# Patient Record
Sex: Female | Born: 1990 | Race: Black or African American | Hispanic: No | Smoking: Current every day smoker
Health system: Southern US, Community
[De-identification: ages and names within clinical notes are randomized; demographics above are authoritative.]

## PROBLEM LIST (undated history)

## (undated) ENCOUNTER — Inpatient Hospital Stay: Payer: Self-pay

## (undated) DIAGNOSIS — Z8742 Personal history of other diseases of the female genital tract: Secondary | ICD-10-CM

## (undated) DIAGNOSIS — A6000 Herpesviral infection of urogenital system, unspecified: Secondary | ICD-10-CM

## (undated) HISTORY — DX: Herpesviral infection of urogenital system, unspecified: A60.00

## (undated) HISTORY — DX: Personal history of other diseases of the female genital tract: Z87.42

## (undated) HISTORY — PX: TONSILLECTOMY: SUR1361

---
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
## 2010-06-20 ENCOUNTER — Emergency Department: Payer: Self-pay | Admitting: Emergency Medicine

## 2011-07-09 ENCOUNTER — Emergency Department: Payer: Self-pay | Admitting: Emergency Medicine

## 2012-02-27 ENCOUNTER — Observation Stay: Payer: Self-pay | Admitting: Obstetrics and Gynecology

## 2012-03-12 ENCOUNTER — Observation Stay: Payer: Self-pay | Admitting: Obstetrics and Gynecology

## 2012-03-16 ENCOUNTER — Inpatient Hospital Stay: Payer: Self-pay

## 2012-03-17 LAB — CBC WITH DIFFERENTIAL/PLATELET
Basophil #: 0 10*3/uL (ref 0.0–0.1)
Basophil %: 0.2 %
Eosinophil #: 0 10*3/uL (ref 0.0–0.7)
Eosinophil %: 0.2 %
HCT: 41.9 % (ref 35.0–47.0)
HGB: 13.4 g/dL (ref 12.0–16.0)
Lymphocyte #: 2.5 10*3/uL (ref 1.0–3.6)
Lymphocyte %: 18.2 %
MCH: 26.9 pg (ref 26.0–34.0)
MCHC: 31.9 g/dL — ABNORMAL LOW (ref 32.0–36.0)
MCV: 84 fL (ref 80–100)
Monocyte #: 1.6 x10 3/mm — ABNORMAL HIGH (ref 0.2–0.9)
Monocyte %: 11.8 %
Neutrophil #: 9.6 10*3/uL — ABNORMAL HIGH (ref 1.4–6.5)
Neutrophil %: 69.6 %
Platelet: 170 10*3/uL (ref 150–440)
RBC: 4.97 10*6/uL (ref 3.80–5.20)
RDW: 14.6 % — ABNORMAL HIGH (ref 11.5–14.5)
WBC: 13.9 10*3/uL — ABNORMAL HIGH (ref 3.6–11.0)

## 2012-03-18 LAB — HEMATOCRIT: HCT: 30.9 % — ABNORMAL LOW (ref 35.0–47.0)

## 2012-10-16 ENCOUNTER — Emergency Department: Payer: Self-pay | Admitting: Emergency Medicine

## 2012-10-16 LAB — URINALYSIS, COMPLETE
Bilirubin,UR: NEGATIVE
Glucose,UR: NEGATIVE mg/dL (ref 0–75)
Nitrite: NEGATIVE
Ph: 5 (ref 4.5–8.0)
Protein: 30
RBC,UR: 3 /HPF (ref 0–5)
Specific Gravity: 1.029 (ref 1.003–1.030)
Squamous Epithelial: 13
WBC UR: 11 /HPF (ref 0–5)

## 2012-10-16 LAB — RAPID INFLUENZA A&B ANTIGENS

## 2012-10-18 LAB — BETA STREP CULTURE(ARMC)

## 2014-09-30 LAB — HM PAP SMEAR: HM Pap smear: NEGATIVE

## 2015-08-03 ENCOUNTER — Emergency Department
Admission: EM | Admit: 2015-08-03 | Discharge: 2015-08-03 | Disposition: A | Discharge status: Home / Self Care | Payer: Self-pay | Attending: Emergency Medicine | Admitting: Emergency Medicine

## 2015-08-03 ENCOUNTER — Encounter: Payer: Self-pay | Admitting: Medical Oncology

## 2015-08-03 DIAGNOSIS — K2970 Gastritis, unspecified, without bleeding: Secondary | ICD-10-CM | POA: Insufficient documentation

## 2015-08-03 DIAGNOSIS — F17200 Nicotine dependence, unspecified, uncomplicated: Secondary | ICD-10-CM | POA: Insufficient documentation

## 2015-08-03 DIAGNOSIS — Z3202 Encounter for pregnancy test, result negative: Secondary | ICD-10-CM | POA: Insufficient documentation

## 2015-08-03 LAB — URINALYSIS COMPLETE WITH MICROSCOPIC (ARMC ONLY)
Bacteria, UA: NONE SEEN
Bilirubin Urine: NEGATIVE
Glucose, UA: 50 mg/dL — AB
Hgb urine dipstick: NEGATIVE
Ketones, ur: NEGATIVE mg/dL
Nitrite: NEGATIVE
Protein, ur: NEGATIVE mg/dL
Specific Gravity, Urine: 1.025 (ref 1.005–1.030)
pH: 6 (ref 5.0–8.0)

## 2015-08-03 LAB — COMPREHENSIVE METABOLIC PANEL
ALT: 18 U/L (ref 14–54)
AST: 19 U/L (ref 15–41)
Albumin: 4.6 g/dL (ref 3.5–5.0)
Alkaline Phosphatase: 54 U/L (ref 38–126)
Anion gap: 6 (ref 5–15)
BUN: 16 mg/dL (ref 6–20)
CO2: 27 mmol/L (ref 22–32)
Calcium: 9.2 mg/dL (ref 8.9–10.3)
Chloride: 107 mmol/L (ref 101–111)
Creatinine, Ser: 0.7 mg/dL (ref 0.44–1.00)
GFR calc Af Amer: >60 mL/min (ref >60)
GFR calc non Af Amer: >60 mL/min (ref >60)
Glucose, Bld: 111 mg/dL — ABNORMAL HIGH (ref 65–99)
Potassium: 3.8 mmol/L (ref 3.5–5.1)
Sodium: 140 mmol/L (ref 135–145)
Total Bilirubin: 0.3 mg/dL (ref 0.3–1.2)
Total Protein: 7.4 g/dL (ref 6.5–8.1)

## 2015-08-03 LAB — LIPASE, BLOOD: Lipase: 27 U/L (ref 11–51)

## 2015-08-03 LAB — CBC
HCT: 43.3 % (ref 35.0–47.0)
Hemoglobin: 14 g/dL (ref 12.0–16.0)
MCH: 26.8 pg (ref 26.0–34.0)
MCHC: 32.2 g/dL (ref 32.0–36.0)
MCV: 83.2 fL (ref 80.0–100.0)
Platelets: 210 10*3/uL (ref 150–440)
RBC: 5.21 MIL/uL — ABNORMAL HIGH (ref 3.80–5.20)
RDW: 13.7 % (ref 11.5–14.5)
WBC: 5 10*3/uL (ref 3.6–11.0)

## 2015-08-03 LAB — POCT PREGNANCY, URINE: Preg Test, Ur: NEGATIVE

## 2015-08-03 MED ORDER — GI COCKTAIL ~~LOC~~
30.0000 mL | Freq: Once | ORAL | Status: AC
  Administered 2015-08-03: 30 mL via ORAL
  Filled 2015-08-03: qty 30

## 2015-08-03 MED ORDER — PANTOPRAZOLE SODIUM 40 MG PO TBEC
40.0000 mg | DELAYED_RELEASE_TABLET | Freq: Every day | ORAL | Status: DC
Start: 2015-08-03 — End: 2017-06-10

## 2015-08-03 MED ORDER — PANTOPRAZOLE SODIUM 40 MG PO TBEC: 40.0000 mg | DELAYED_RELEASE_TABLET | Freq: Every day | ORAL | Status: DC

## 2015-08-03 NOTE — ED Provider Notes (Addendum)
[facility] Emergency Department Provider Note  Time seen: 3:28 PM  I have reviewed the triage vital signs and the nursing notes.   HISTORY  Chief Complaint Abdominal Pain    HPI Martha Tucker is a 24 y.o. female with no past medical history who presents the emergency department with upper abdominal pain. According to the patient she has had intermittent upper abdominal pain for many months at this point. For the past 2-3 days she states the pain has been more constant. Denies nausea, vomiting, diarrhea. She states the pain worsens significantly if she eats seafoods such as pepperoni pizza, or lemonade. Describes her pain currently as moderate, burning sensation. Patient also states she suffers from significant gastric reflux on a nightly basis.     History reviewed. No pertinent past medical history.  There are no active problems to display for this patient.   Past Surgical History  Procedure Laterality Date  . Tonsillectomy      No current outpatient prescriptions on file.  Allergies Review of patient's allergies indicates no known allergies.  No family history on file.  Social History Social History  Substance Use Topics  . Smoking status: Current Every Day Smoker  . Smokeless tobacco: None  . Alcohol Use: Yes     Comment: weekends    Review of Systems Constitutional: Negative for fever. Cardiovascular: Negative for chest pain. Respiratory: Negative for shortness of breath. Gastrointestinal: Upper abdominal burning pain. Negative for nausea, vomiting, diarrhea Genitourinary: Negative for dysuria. Musculoskeletal: Negative for back pain. Neurological: Negative for headache 10-point ROS otherwise negative.  ____________________________________________   PHYSICAL EXAM:  VITAL SIGNS: ED Triage Vitals  Enc Vitals Group     BP 08/03/15 1437 134/78 mmHg     Pulse Rate 08/03/15 1437 85     Resp 08/03/15 1437 18     Temp  08/03/15 1437 97.9 F (36.6 C)     Temp Source 08/03/15 1437 Oral     SpO2 08/03/15 1437 98 %     Weight 08/03/15 1437 135 lb (61.236 kg)     Height 08/03/15 1437 5\' 1"  (1.549 m)     Head Cir --      Peak Flow --      Pain Score 08/03/15 1438 5     Pain Loc --      Pain Edu? --      Excl. in GC? --     Constitutional: Alert and oriented. Well appearing and in no distress. Eyes: Normal exam ENT   Head: Normocephalic and atraumatic.   Mouth/Throat: Mucous membranes are moist. Cardiovascular: Normal rate, regular rhythm. No murmur Respiratory: Normal respiratory effort without tachypnea nor retractions. Breath sounds are clear and equal bilaterally. No wheezes/rales/rhonchi. Gastrointestinal: Mild epigastric tenderness to palpation, no right upper quadrant tenderness. Otherwise benign abdominal exam. No rebound or guarding. Musculoskeletal: Nontender with normal range of motion in all extremities. Neurologic:  Normal speech and language. No gross focal neurologic deficits  Skin:  Skin is warm, dry and intact.  Psychiatric: Mood and affect are normal. Speech and behavior are normal.   ____________________________________________    INITIAL IMPRESSION / ASSESSMENT AND PLAN / ED COURSE  Pertinent labs & imaging results that were available during my care of the patient were reviewed by me and considered in my medical decision making (see chart for details).  Patient presents with signs and symptoms most suggestive of gastritis. Labs within normal limits including a normal lipase. I discussed the possible diagnoses  with the patient, she confirms the pain is much worse with spicy food intake such as pepperoni pizza or lemonade. Also states she drinks alcohol on a daily basis, denies NSAID use. We will try GI cocktail in the emergency department for pain relief. Overall the patient appears very well, I discussed the need to follow up with a GI medicine doctor, patient is  agreeable.  States pain is nearly resolved after GI cocktail. We will discharge home with GI follow-up.  ____________________________________________   FINAL CLINICAL IMPRESSION(S) / ED DIAGNOSES  Gastritis   Minna Antis, MD 08/03/15 1531  Minna Antis, MD 08/03/15 [phone]

## 2015-08-03 NOTE — ED Notes (Signed)
Pt reports upper abd pain that has been occuring x 3 days. Denies nvd. Reports she was constipated but had BM this am.

## 2015-08-03 NOTE — ED Notes (Signed)
NAD noted at time of D/C. Pt denies questions or concerns. Pt ambulatory to the lobby at this time.  

## 2015-08-03 NOTE — Discharge Instructions (Signed)
Please call the number provided to follow-up with GI medicine for further workup and evaluation. Please avoid alcohol intake. Avoid NSAIDs such as ibuprofen, Motrin, Aleve, Advil, aspirin.   Gastritis, Adult Gastritis is soreness and puffiness (inflammation) of the lining of the stomach. If you do not get help, gastritis can cause bleeding and sores (ulcers) in the stomach. HOME CARE   Only take medicine as told by your doctor.  If you were given antibiotic medicines, take them as told. Finish the medicines even if you start to feel better.  Drink enough fluids to keep your pee (urine) clear or pale yellow.  Avoid foods and drinks that make your problems worse. Foods you may want to avoid include:  Caffeine or alcohol.  Chocolate.  Mint.  Garlic and onions.  Spicy foods.  Citrus fruits, including oranges, lemons, or limes.  Food containing tomatoes, including sauce, chili, salsa, and pizza.  Fried and fatty foods.  Eat small meals throughout the day instead of large meals. GET HELP RIGHT AWAY IF:   You have black or dark red poop (stools).  You throw up (vomit) blood. It may look like coffee grounds.  You cannot keep fluids down.  Your belly (abdominal) pain gets worse.  You have a fever.  You do not feel better after 1 week.  You have any other questions or concerns. MAKE SURE YOU:   Understand these instructions.  Will watch your condition.  Will get help right away if you are not doing well or get worse.   This information is not intended to replace advice given to you by your health care provider. Make sure you discuss any questions you have with your health care provider.   Document Released: 01/30/2008 Document Revised: 11/05/2011 Document Reviewed: 09/26/2011 Elsevier Interactive Patient Education [employer]2016 [employer] Inc.

## 2016-04-10 ENCOUNTER — Encounter: Payer: Self-pay | Admitting: Emergency Medicine

## 2016-04-10 ENCOUNTER — Emergency Department
Admission: EM | Admit: 2016-04-10 | Discharge: 2016-04-10 | Disposition: A | Discharge status: Home / Self Care | Payer: Self-pay | Attending: Emergency Medicine | Admitting: Emergency Medicine

## 2016-04-10 DIAGNOSIS — F17200 Nicotine dependence, unspecified, uncomplicated: Secondary | ICD-10-CM | POA: Insufficient documentation

## 2016-04-10 DIAGNOSIS — Z79899 Other long term (current) drug therapy: Secondary | ICD-10-CM | POA: Insufficient documentation

## 2016-04-10 DIAGNOSIS — N390 Urinary tract infection, site not specified: Secondary | ICD-10-CM | POA: Insufficient documentation

## 2016-04-10 LAB — URINALYSIS COMPLETE WITH MICROSCOPIC (ARMC ONLY)
Bacteria, UA: NONE SEEN
Bilirubin Urine: NEGATIVE
Glucose, UA: NEGATIVE mg/dL
Ketones, ur: NEGATIVE mg/dL
Nitrite: NEGATIVE
Protein, ur: 30 mg/dL — AB
Specific Gravity, Urine: 1.023 (ref 1.005–1.030)
pH: 6 (ref 5.0–8.0)

## 2016-04-10 LAB — PREGNANCY, URINE: Preg Test, Ur: NEGATIVE

## 2016-04-10 MED ORDER — PHENAZOPYRIDINE HCL 100 MG PO TABS: 100.0000 mg | ORAL_TABLET | Freq: Three times a day (TID) | ORAL | 0 refills | Status: DC | PRN

## 2016-04-10 MED ORDER — SULFAMETHOXAZOLE-TRIMETHOPRIM 800-160 MG PO TABS: 1.0000 | ORAL_TABLET | Freq: Two times a day (BID) | ORAL | 0 refills | Status: AC

## 2016-04-10 NOTE — ED Triage Notes (Signed)
Pt reports frequent, burning urination. Reports occasional flank pain when she tried to urinate

## 2016-04-10 NOTE — ED Provider Notes (Signed)
[facility] Emergency Department Provider Note  ____________________________________________  Time seen: Approximately 4:23 PM  I have reviewed the triage vital signs and the nursing notes.   HISTORY  Chief Complaint Urinary Tract Infection    HPI Martha Tucker is a 25 y.o. female, NAD, presents to the emergency department for evaluation of increased urinary frequency and hesitancy with lower abdominal pressure. Patient states that yesterday she was having increased urinary frequency but without itching, burning with urination, foul smell to her urine, vaginal discharge or suprapubic pain. This morning, states she started to have lower abdominal pressure and a feeling of incomplete voiding. She notes lower back ache at times that has now resolved. Patient denies fever, chills, body aches. No nausea, vomiting, diarrhea, saddle paraesthesias, colicky abdominal pain, loss of bowel or bladder control. She has not taken anything OTC to help with her symptoms.   History reviewed. No pertinent past medical history.  There are no active problems to display for this patient.   Past Surgical History:  Procedure Laterality Date  . TONSILLECTOMY      Prior to Admission medications   Medication Sig Start Date End Date Taking? Authorizing Provider  pantoprazole (PROTONIX) 40 MG tablet Take 1 tablet (40 mg total) by mouth daily. 08/03/15 08/02/16  Minna AntisKevin Paduchowski, MD  pantoprazole (PROTONIX) 40 MG tablet Take 1 tablet (40 mg total) by mouth daily. 08/03/15 08/02/16  Minna AntisKevin Paduchowski, MD  phenazopyridine (PYRIDIUM) 100 MG tablet Take 1 tablet (100 mg total) by mouth 3 (three) times daily as needed for pain (May take 1-2 as needed three times daily). 04/10/16   [name], PA-C  sulfamethoxazole-trimethoprim (BACTRIM DS,SEPTRA DS) 800-160 MG tablet Take 1 tablet by mouth 2 (two) times daily. 04/10/16 04/15/16  [name], PA-C    Allergies Review of patient's  allergies indicates no known allergies.  No family history on file.  Social History Social History  Substance Use Topics  . Smoking status: Current Every Day Smoker  . Smokeless tobacco: Never Used  . Alcohol use Yes     Comment: weekends     Review of Systems  Constitutional: No fever/chills, fatigue Cardiovascular: No chest pain. Respiratory: No shortness of breath. No wheezing.  Gastrointestinal: Positive lower abdominal pressure. No colicky abdominal pain.  No nausea, vomiting.  No diarrhea. Genitourinary: Positive urinary hesitancy, urgency, and increased frequency. Negative for dysuria, hematuria, vaginal discharge, pelvic pain.  Musculoskeletal: Positive lower back ache Skin: Negative for rash. Neurological: Negative for headaches, focal weakness or numbness. No saddle paraesthesias, loss of bowel or bladder control 10-point ROS otherwise negative.  ____________________________________________   PHYSICAL EXAM:  VITAL SIGNS: ED Triage Vitals  Enc Vitals Group     BP 04/10/16 1616 109/72     Pulse Rate 04/10/16 1616 77     Resp 04/10/16 1616 18     Temp 04/10/16 1616 98.4 F (36.9 C)     Temp Source 04/10/16 1616 Oral     SpO2 04/10/16 1616 100 %     Weight 04/10/16 1617 146 lb (66.2 kg)     Height 04/10/16 1617 5' (1.524 m)     Head Circumference --      Peak Flow --      Pain Score --      Pain Loc --      Pain Edu? --      Excl. in GC? --      Constitutional: Alert and oriented. Well appearing and in no  acute distress. Eyes: Conjunctivae are normal without icterus or injection Head: Atraumatic. Hematological/Lymphatic/Immunilogical: No cervical lymphadenopathy. Cardiovascular: Normal rate, regular rhythm. Normal S1 and S2.  Good peripheral circulation. Respiratory: Normal respiratory effort without tachypnea or retractions. Lungs CTAB with breath sounds noted in all lung fields. Gastrointestinal: Soft and nontender without distention or guarding in all  quadrants. No rigidity or rebound. No CVA tenderness bilaterally. Neurologic:  Normal speech and language. No gross focal neurologic deficits are appreciated.  Skin:  Skin is warm, dry and intact. No rash noted. Psychiatric: Mood and affect are normal. Speech and behavior are normal. Patient exhibits appropriate insight and judgement.   ____________________________________________   LABS (all labs ordered are listed, but only abnormal results are displayed)  Labs Reviewed  URINALYSIS COMPLETEWITH MICROSCOPIC ([facility] ONLY) - Abnormal; Notable for the following:       Result Value   Color, Urine YELLOW (*)    APPearance HAZY (*)    Hgb urine dipstick 3+ (*)    Protein, ur 30 (*)    Leukocytes, UA 2+ (*)    Squamous Epithelial / LPF 6-30 (*)    All other components within normal limits  URINE CULTURE  PREGNANCY, URINE   ____________________________________________  EKG  None ____________________________________________  RADIOLOGY  None ____________________________________________    PROCEDURES  Procedure(s) performed: None   Procedures   Medications - No data to display   ____________________________________________   INITIAL IMPRESSION / ASSESSMENT AND PLAN / ED COURSE  Pertinent labs & imaging results that were available during my care of the patient were reviewed by me and considered in my medical decision making (see chart for details).  Clinical Course    Patient's diagnosis is consistent with lower UTI. Patient has no symptoms of colic nor radiating back or abdominal pain and overall feels well other than lower urinary pressure.  Patient will be discharged home with prescriptions for Pyridium and Bactrim DS to take as directed. [facility] if symptoms persist past this treatment course. Patient is given ED precautions to return to the ED for any worsening or new symptoms.     ____________________________________________  FINAL CLINICAL IMPRESSION(S) / ED DIAGNOSES  Final diagnoses:  UTI (lower urinary tract infection)      NEW MEDICATIONS STARTED DURING THIS VISIT:  New Prescriptions   PHENAZOPYRIDINE (PYRIDIUM) 100 MG TABLET    Take 1 tablet (100 mg total) by mouth 3 (three) times daily as needed for pain (May take 1-2 as needed three times daily).   SULFAMETHOXAZOLE-TRIMETHOPRIM (BACTRIM DS,SEPTRA DS) 800-160 MG TABLET    Take 1 tablet by mouth 2 (two) times daily.         Hope PigeonJami L Eloy Fehl, PA-C 04/10/16 1744    Nita Sicklearolina Veronese, MD 04/11/16 [phone]

## 2016-04-13 LAB — URINE CULTURE: Culture: >=100000 — AB

## 2016-11-19 ENCOUNTER — Emergency Department
Admission: EM | Admit: 2016-11-19 | Discharge: 2016-11-19 | Disposition: A | Discharge status: Home / Self Care | Payer: No Typology Code available for payment source | Attending: Emergency Medicine | Admitting: Emergency Medicine

## 2016-11-19 ENCOUNTER — Encounter: Payer: Self-pay | Admitting: Emergency Medicine

## 2016-11-19 DIAGNOSIS — Y999 Unspecified external cause status: Secondary | ICD-10-CM | POA: Insufficient documentation

## 2016-11-19 DIAGNOSIS — F17210 Nicotine dependence, cigarettes, uncomplicated: Secondary | ICD-10-CM | POA: Insufficient documentation

## 2016-11-19 DIAGNOSIS — M25512 Pain in left shoulder: Secondary | ICD-10-CM | POA: Insufficient documentation

## 2016-11-19 DIAGNOSIS — R51 Headache: Secondary | ICD-10-CM | POA: Insufficient documentation

## 2016-11-19 DIAGNOSIS — Y92410 Unspecified street and highway as the place of occurrence of the external cause: Secondary | ICD-10-CM | POA: Insufficient documentation

## 2016-11-19 DIAGNOSIS — Y9389 Activity, other specified: Secondary | ICD-10-CM | POA: Insufficient documentation

## 2016-11-19 DIAGNOSIS — S4992XA Unspecified injury of left shoulder and upper arm, initial encounter: Secondary | ICD-10-CM | POA: Diagnosis present

## 2016-11-19 MED ORDER — CYCLOBENZAPRINE HCL 5 MG PO TABS: 5.0000 mg | ORAL_TABLET | Freq: Three times a day (TID) | ORAL | 0 refills | Status: AC | PRN

## 2016-11-19 MED ORDER — CYCLOBENZAPRINE HCL 5 MG PO TABS: 5.0000 mg | ORAL_TABLET | Freq: Three times a day (TID) | ORAL | 0 refills | Status: DC | PRN
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
## 2017-06-10 ENCOUNTER — Encounter: Payer: Self-pay | Admitting: Emergency Medicine

## 2017-06-10 ENCOUNTER — Emergency Department: Payer: Medicaid Other

## 2017-06-10 ENCOUNTER — Emergency Department
Admission: EM | Admit: 2017-06-10 | Discharge: 2017-06-11 | Disposition: A | Discharge status: Home / Self Care | Payer: Medicaid Other | Attending: Emergency Medicine | Admitting: Emergency Medicine

## 2017-06-10 DIAGNOSIS — Z7729 Contact with and (suspected ) exposure to other hazardous substances: Secondary | ICD-10-CM

## 2017-06-10 DIAGNOSIS — T5891XA Toxic effect of carbon monoxide from unspecified source, accidental (unintentional), initial encounter: Secondary | ICD-10-CM | POA: Diagnosis not present

## 2017-06-10 DIAGNOSIS — Z3A09 9 weeks gestation of pregnancy: Secondary | ICD-10-CM | POA: Insufficient documentation

## 2017-06-10 DIAGNOSIS — Y939 Activity, unspecified: Secondary | ICD-10-CM | POA: Diagnosis not present

## 2017-06-10 DIAGNOSIS — F17210 Nicotine dependence, cigarettes, uncomplicated: Secondary | ICD-10-CM | POA: Insufficient documentation

## 2017-06-10 DIAGNOSIS — O99331 Smoking (tobacco) complicating pregnancy, first trimester: Secondary | ICD-10-CM | POA: Insufficient documentation

## 2017-06-10 DIAGNOSIS — Y990 Civilian activity done for income or pay: Secondary | ICD-10-CM | POA: Diagnosis not present

## 2017-06-10 DIAGNOSIS — O9A211 Injury, poisoning and certain other consequences of external causes complicating pregnancy, first trimester: Secondary | ICD-10-CM | POA: Diagnosis not present

## 2017-06-10 DIAGNOSIS — Y929 Unspecified place or not applicable: Secondary | ICD-10-CM | POA: Diagnosis not present

## 2017-06-10 DIAGNOSIS — Z0000 Encounter for general adult medical examination without abnormal findings: Secondary | ICD-10-CM

## 2017-06-10 LAB — BASIC METABOLIC PANEL
Anion gap: 8 (ref 5–15)
BUN: 16 mg/dL (ref 6–20)
CO2: 24 mmol/L (ref 22–32)
Calcium: 8.8 mg/dL — ABNORMAL LOW (ref 8.9–10.3)
Chloride: 105 mmol/L (ref 101–111)
Creatinine, Ser: 0.53 mg/dL (ref 0.44–1.00)
GFR calc Af Amer: >60 mL/min (ref >60)
GFR calc non Af Amer: >60 mL/min (ref >60)
Glucose, Bld: 100 mg/dL — ABNORMAL HIGH (ref 65–99)
Potassium: 3.4 mmol/L — ABNORMAL LOW (ref 3.5–5.1)
Sodium: 137 mmol/L (ref 135–145)

## 2017-06-10 LAB — CBC WITH DIFFERENTIAL/PLATELET
Basophils Absolute: 0 10*3/uL (ref 0–0.1)
Basophils Relative: 0 %
Eosinophils Absolute: 0.1 10*3/uL (ref 0–0.7)
Eosinophils Relative: 1 %
HCT: 38.1 % (ref 35.0–47.0)
Hemoglobin: 12.8 g/dL (ref 12.0–16.0)
Lymphocytes Relative: 40 %
Lymphs Abs: 4.1 10*3/uL — ABNORMAL HIGH (ref 1.0–3.6)
MCH: 27.3 pg (ref 26.0–34.0)
MCHC: 33.7 g/dL (ref 32.0–36.0)
MCV: 81.1 fL (ref 80.0–100.0)
Monocytes Absolute: 1.2 10*3/uL — ABNORMAL HIGH (ref 0.2–0.9)
Monocytes Relative: 12 %
Neutro Abs: 4.9 10*3/uL (ref 1.4–6.5)
Neutrophils Relative %: 47 %
Platelets: 237 10*3/uL (ref 150–440)
RBC: 4.7 MIL/uL (ref 3.80–5.20)
RDW: 14.2 % (ref 11.5–14.5)
WBC: 10.3 10*3/uL (ref 3.6–11.0)

## 2017-06-10 LAB — HCG, QUANTITATIVE, PREGNANCY: hCG, Beta Chain, Quant, S: 3 m[IU]/mL (ref <5)

## 2017-06-10 LAB — CARBOXYHEMOGLOBIN - COOX: Carboxyhemoglobin: 6 % (ref 0.5–1.5)

## 2017-06-10 NOTE — ED Provider Notes (Signed)
[facility] Emergency Department Provider Note   ____________________________________________   First MD Initiated Contact with Patient 06/10/17 2144     (approximate)  I have reviewed the triage vital signs and the nursing notes.   HISTORY  Chief Complaint Toxic Inhalation    HPI Martha Tucker is a 26 y.o. female Who was at work when the fryer which was malfunctioning started to malfunction again and was shut off there was a smell of natural gas in the air patient couldn't stand it anymore and left. The fryer was shut off the gas smell started at 550 tonight and she left work at 6:30. At 7:00 she was called by her work and told our department had said there was carbon monoxide and she should go to the emergency room and get checked.she got here about 8:15. She reports she had headache and nausea and little bit of blurry vision while she was driving when she got here she just had a headache and nausea. She is 8-[redacted] weeks pregnant. She does not smoke. No one in her household smokes at least not in the house. Patient was put on her percent oxygen by nonrebreather immediately after she had her blood gas done at 846. Blood gas shows a carboxyhemoglobin level of 6. I discussed the patient with Dr. Jacki Cones on call for the carbon monoxide and hyperbaric chamber at [facility] he suggested that at the present time it would probably be just as useful to keep her here on 100% nonrebreather for another 5 hours recheck the carbon monoxide level and let her go if nothing else developed.   No past medical history on file. patient denies any other past medical history There are no active problems to display for this patient.   Past Surgical History:  Procedure Laterality Date  . TONSILLECTOMY      Prior to Admission medications   Not on File    Allergies Patient has no known allergies.  No family history on file.  Social History Social History  Substance Use  Topics  . Smoking status: Current Every Day Smoker    Packs/day: 1.00    Types: Cigarettes  . Smokeless tobacco: Never Used  . Alcohol use Yes     Comment: weekends    Review of Systems  Constitutional: No fever/chills Eyes: No visual changes. ENT: No sore throat. Cardiovascular: Denies chest pain. Respiratory: Denies shortness of breath. Gastrointestinal: No abdominal pain.  No nausea, no vomiting.  No diarrhea.  No constipation. Genitourinary: Negative for dysuria. Musculoskeletal: Negative for back pain. Skin: Negative for rash. Neurological: Negative for focal weakness or numbness.  ____________________________________________   PHYSICAL EXAM:  VITAL SIGNS: ED Triage Vitals  Enc Vitals Group     BP 06/10/17 2029 133/76     Pulse Rate 06/10/17 2029 81     Resp 06/10/17 2029 20     Temp 06/10/17 2029 98.4 F (36.9 C)     Temp Source 06/10/17 2029 Oral     SpO2 06/10/17 2029 100 %     Weight 06/10/17 2031 141 lb (64 kg)     Height 06/10/17 2031  (1.549 m)     Head Circumference --      Peak Flow --      Pain Score 06/10/17 2029 4     Pain Loc --      Pain Edu? --      Excl. in GC? --     Constitutional: Alert and oriented.  Well appearing and in no acute distress. Eyes: Conjunctivae are normal. PER. EOMI. Head: Atraumatic. Nose: No congestion/rhinnorhea. Mouth/Throat: Mucous membranes are moist.  Oropharynx non-erythematous. Neck: No stridor.  Cardiovascular: Normal rate, regular rhythm. Grossly normal heart sounds.  Good peripheral circulation. Respiratory: Normal respiratory effort.  No retractions. Lungs CTAB. Gastrointestinal: Soft and nontender. No distention. No abdominal bruits. No CVA tenderness. }Musculoskeletal: No lower extremity tenderness nor edema.  No joint effusions. Neurologic:  Normal speech and language. No gross focal neurologic deficits are appreciated. cranial nerves II through XII are intact of the visual fields were not checked  cerebellar finger to nose are normal heel-to-shin is normal as well later strength is 5 over 5 throughout and sensation is intact throughout patient follows all commands and instructions quickly and accurately. Skin:  Skin is warm, dry and intact. No rash noted. Psychiatric: Mood and affect are normal. Speech and behavior are normal.  ____________________________________________   LABS (all labs ordered are listed, but only abnormal results are displayed)  Labs Reviewed  CARBOXYHEMOGLOBIN - COOX - Abnormal; Notable for the following:       Result Value   Carboxyhemoglobin 6.0 (*)    All other components within normal limits  BASIC METABOLIC PANEL - Abnormal; Notable for the following:    Potassium 3.4 (*)    Glucose, Bld 100 (*)    Calcium 8.8 (*)    All other components within normal limits  CBC WITH DIFFERENTIAL/PLATELET - Abnormal; Notable for the following:    Lymphs Abs 4.1 (*)    Monocytes Absolute 1.2 (*)    All other components within normal limits  HCG, QUANTITATIVE, PREGNANCY   ____________________________________________  EKG   ____________________________________________  RADIOLOGY  No results found.  ____________________________________________   PROCEDURES  Procedure(s) performed:  Procedures  Critical Care performed:   ____________________________________________   INITIAL IMPRESSION / ASSESSMENT AND PLAN / ED COURSE  discussed in detail with Dr. Jacki Cones at [facility] as noted above he feels it is just as useful to keep her here on  100% oxygen as to transport her to [facility] and put her on hyperbaric. I agree with this and is in line with recommendations on previous patient's. Please see history of present illness for more details. I will sign the patient out to Dr. Zenda Alpers to continue monitoring her for the rest of the 5 hours and to check on the ultrasound.      ____________________________________________   FINAL CLINICAL IMPRESSION(S) / ED  DIAGNOSES  Final diagnoses:  Exposure to carbon monoxide      NEW MEDICATIONS STARTED DURING THIS VISIT:  New Prescriptions   No medications on file     Note:  This document was prepared using Dragon voice recognition software and may include unintentional dictation errors.    Arnaldo Natal, MD 06/10/17 [phone]

## 2017-06-10 NOTE — ED Triage Notes (Signed)
Patient reports while at work she was exposed to gas for approximately 30 minutes and was experiencing headache and nausea.  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  Patient is approximately 8-[redacted] weeks pregnant.

## 2017-06-10 NOTE — ED Notes (Signed)
Dr Darnelle Catalan to subwait to assess pt

## 2017-06-10 NOTE — ED Notes (Signed)
Reviewed workers comp profile, but pt declines to file at present.

## 2017-06-11 ENCOUNTER — Encounter: Payer: Self-pay | Admitting: Radiology

## 2017-06-11 LAB — CARBOXYHEMOGLOBIN - COOX: Carboxyhemoglobin: 2.2 % — ABNORMAL HIGH (ref 0.5–1.5)

## 2017-06-11 NOTE — Discharge Instructions (Signed)
Please follow up with your primary care physician and your OB/GYN °

## 2017-06-11 NOTE — ED Provider Notes (Signed)
-----------------------------------------   3:04 AM on 06/11/2017 -----------------------------------------   Blood pressure 111/77, pulse 78, temperature 98.4 F (36.9 C), temperature source Oral, resp. rate 18, height  (1.549 m), weight 64 kg (141 lb), last menstrual period 03/20/2017, SpO2 100 %, unknown if currently breastfeeding.  Assuming care from Dr. Darnelle Catalan.  In short, Martha Tucker is a 26 y.o. female with a chief complaint of Toxic Inhalation .  Refer to the original H&P for additional details.  The current plan of care is to follow-up the results of the ultrasound and the repeat carboxyhemoglobin.  The patient's ultrasound shows a single intrauterine pregnancy with estimated gestational age of [redacted] weeks. She has a small subchorionic hemorrhage.  I repeated the carboxyhemoglobin and was found to be 2.2. The patient's symptoms are improved. She'll be discharged home to follow-up with her OB/GYN.        Rebecka Apley, MD 06/11/17 [phone]

## 2017-06-11 NOTE — ED Notes (Signed)
Lab states carboxyhemoglobin by ABG only. Respiratory consulted and will be to bedside to collect.

## 2017-06-13 DIAGNOSIS — A6000 Herpesviral infection of urogenital system, unspecified: Secondary | ICD-10-CM | POA: Insufficient documentation

## 2017-10-31 LAB — HM HIV SCREENING LAB: HM HIV Screening: NEGATIVE

## 2017-11-27 ENCOUNTER — Other Ambulatory Visit: Payer: Self-pay

## 2017-11-27 ENCOUNTER — Observation Stay
Admission: EM | Admit: 2017-11-27 | Discharge: 2017-11-28 | Disposition: A | Discharge status: Home / Self Care | Payer: Medicaid Other | Attending: Obstetrics and Gynecology | Admitting: Obstetrics and Gynecology

## 2017-11-27 DIAGNOSIS — R1030 Lower abdominal pain, unspecified: Secondary | ICD-10-CM

## 2017-11-27 DIAGNOSIS — Z3A33 33 weeks gestation of pregnancy: Secondary | ICD-10-CM | POA: Insufficient documentation

## 2017-11-27 DIAGNOSIS — O26893 Other specified pregnancy related conditions, third trimester: Secondary | ICD-10-CM

## 2017-11-27 DIAGNOSIS — M545 Low back pain: Secondary | ICD-10-CM | POA: Insufficient documentation

## 2017-11-27 DIAGNOSIS — R109 Unspecified abdominal pain: Secondary | ICD-10-CM

## 2017-11-27 LAB — URINALYSIS, COMPLETE (UACMP) WITH MICROSCOPIC
Bilirubin Urine: NEGATIVE
Glucose, UA: 150 mg/dL — AB
Hgb urine dipstick: NEGATIVE
Ketones, ur: 5 mg/dL — AB
Leukocytes, UA: NEGATIVE
Nitrite: NEGATIVE
Protein, ur: 30 mg/dL — AB
Specific Gravity, Urine: 1.027 (ref 1.005–1.030)
pH: 5 (ref 5.0–8.0)

## 2017-11-27 MED ORDER — ACETAMINOPHEN 325 MG PO TABS: 650.0000 mg | ORAL_TABLET | ORAL | Status: DC | PRN

## 2017-11-27 NOTE — OB Triage Note (Signed)
Patient given discharge instructions verbally per CNM. All questions answered. Patient given AVS and work note. Discharged in stable condition accompanied by mother and daughter.

## 2017-11-27 NOTE — Final Progress Note (Signed)
Physician Final Progress Note  Patient ID: Martha Tucker MRN: Camila [id number] DOB/AGE: [birth year] 27 y.o.  Admit date: 11/27/2017 Admitting provider: Natale Milchhristanna R Schuman, MD Discharge date: 11/27/2017   Admission Diagnoses: Abdominal pain  Discharge Diagnoses:  Active Problems:   Abdominal pain during pregnancy, third trimester   History of Present Illness: The patient is a 27 y.o. female G2P1001 at 5262w2d who presents for intermittent lower abdominal pains that began about three days ago and are usually at night. She felt the pains today at work as well and needed to sit down for much of her shift. She rates the intensity as 8/10. Nothing makes them better. Walking and standing makes them worse. They are usually gone in the morning. She is also having some lower back pain. Denies dysuria, loss of fluid, vaginal bleeding and discharge. Endorses good fetal movement.  10 point review of systems negative unless otherwise noted in HPI.  History reviewed. No pertinent past medical history.  Past Surgical History:  Procedure Laterality Date  . TONSILLECTOMY      No current facility-administered medications on file prior to encounter.    Current Outpatient Medications on File Prior to Encounter  Medication Sig Dispense Refill  . Prenatal Vit-Fe Fumarate-FA (MULTIVITAMIN-PRENATAL) 27-0.8 MG TABS tablet Take 1 tablet by mouth daily at 12 noon.      No Known Allergies  Social History   Socioeconomic History  . Marital status: Single    Spouse name: Not on file  . Number of children: Not on file  . Years of education: Not on file  . Highest education level: Not on file  Occupational History  . Not on file  Social Needs  . Financial resource strain: Not on file  . Food insecurity:    Worry: Not on file    Inability: Not on file  . Transportation needs:    Medical: Not on file    Non-medical: Not on file  Tobacco Use  . Smoking status: Former Smoker    Packs/day: 1.00   Types: Cigarettes  . Smokeless tobacco: Never Used  Substance and Sexual Activity  . Alcohol use: Yes    Comment: weekends  . Drug use: Never  . Sexual activity: Not on file  Lifestyle  . Physical activity:    Days per week: Not on file    Minutes per session: Not on file  . Stress: Not on file  Relationships  . Social connections:    Talks on phone: Not on file    Gets together: Not on file    Attends religious service: Not on file    Active member of club or organization: Not on file    Attends meetings of clubs or organizations: Not on file    Relationship status: Not on file  . Intimate partner violence:    Fear of current or ex partner: Not on file    Emotionally abused: Not on file    Physically abused: Not on file    Forced sexual activity: Not on file  Other Topics Concern  . Not on file  Social History Narrative  . Not on file    Physical Exam: BP 113/77 (BP Location: Left Arm)   Pulse 90   Temp 98.4 F (36.9 C) (Oral)   Resp 18   Ht 5' 1.5" (1.562 m)   Wt 158 lb (71.7 kg)   LMP 03/20/2017 (Approximate)   BMI 29.37 kg/m   Gen: NAD CV: regular rate Pulm: no increased work  of breathing Pelvic: closed/thick/-3 Ext: no signs of DVT on exam  NST: Baseline: 130 Variability: moderate Accelerations: present Decelerations: absent Tocometry: occasional contraction The patient was monitored for 30+ minutes, fetal heart rate tracing was deemed reactive, category I tracing.  Consults: None  Significant Findings/ Diagnostic Studies: labs: UA negative, urine culture pending  Procedures: SVE, NST  Discharge Condition: good  Disposition: Discharge disposition: 01-Home or Self Care      Diet: Regular diet  Discharge Activity: Activity as tolerated  Discharge Instructions    Discharge activity:  No Restrictions   Complete by:  As directed    Discharge diet:  No restrictions   Complete by:  As directed    No sexual activity restrictions   Complete  by:  As directed    Notify physician for a general feeling that "something is not right"   Complete by:  As directed    Notify physician for increase or change in vaginal discharge   Complete by:  As directed    Notify physician for intestinal cramps, with or without diarrhea, sometimes described as "gas pain"   Complete by:  As directed    Notify physician for leaking of fluid   Complete by:  As directed    Notify physician for low, dull backache, unrelieved by heat or Tylenol   Complete by:  As directed    Notify physician for menstrual like cramps   Complete by:  As directed    Notify physician for pelvic pressure   Complete by:  As directed    Notify physician for uterine contractions.  These may be painless and feel like the uterus is tightening or the baby is  "balling up"   Complete by:  As directed    Notify physician for vaginal bleeding   Complete by:  As directed    PRETERM LABOR:  Includes any of the follwing symptoms that occur between 20 - [redacted] weeks gestation.  If these symptoms are not stopped, preterm labor can result in preterm delivery, placing your baby at risk   Complete by:  As directed      Allergies as of 11/27/2017   No Known Allergies     Medication List    TAKE these medications   multivitamin-prenatal 27-0.8 MG Tabs tablet Take 1 tablet by mouth daily at 12 noon.      Advised rest and good hydration to relieve uterine irritability. Comfort measures for pain including intermittent heat application, Tylenol. Return to care for any signs of preterm labor.  Total time spent taking care of this patient: 10 minutes.  Signed: Oswaldo Conroy, CNM  11/27/2017, 11:50 PM

## 2017-11-27 NOTE — OB Triage Note (Signed)
Pt arrived in triage with c/o "lower sharp pains" that "mostly come around night time" in her abdomen. Pt reports good fetal movement. Denies leaking of fluid or vaginal bleeding. Monitors applied and assessing.

## 2017-11-28 DIAGNOSIS — O26893 Other specified pregnancy related conditions, third trimester: Secondary | ICD-10-CM | POA: Diagnosis not present

## 2017-11-28 NOTE — Discharge Summary (Signed)
See Final Progress Note dated 11/27/2017.  Martha Tucker, CNM 11/28/2017  12:00 AM

## 2017-11-29 LAB — URINE CULTURE: Culture: >=100000 — AB

## 2017-11-29 NOTE — Progress Notes (Signed)
Called patient with result. She is not having symptoms a UTI, will not treat at this time since lactobacillus is a normal vaginal bacteria.

## 2017-12-02 ENCOUNTER — Telehealth: Payer: Self-pay

## 2017-12-02 NOTE — Telephone Encounter (Signed)
Pt returning call about her urine test results.

## 2017-12-02 NOTE — Telephone Encounter (Signed)
Pt aware of results. No UTI at this time.

## 2018-04-07 ENCOUNTER — Encounter (HOSPITAL_COMMUNITY): Payer: Self-pay

## 2019-04-27 IMAGING — US US OB TRANSVAGINAL
1 series · 13 of 28 positions shown · non-contrast
Comparison: None.

CLINICAL DATA: Patient indicate she is approximately 8 weeks
pregnant but beta HCG is negative. Patient was exposed to carbon
monoxide.

EXAM:
OBSTETRIC <14 WK US AND TRANSVAGINAL OB US
TECHNIQUE: Both transabdominal and transvaginal ultrasound examinations were
performed for complete evaluation of the gestation as well as the
maternal uterus, adnexal regions, and pelvic cul-de-sac.
Transvaginal technique was performed to assess early pregnancy.

[Series 1: us ob transvaginal · 0.18mm/px · 13 of 86 slices shown]
[im 4/86]
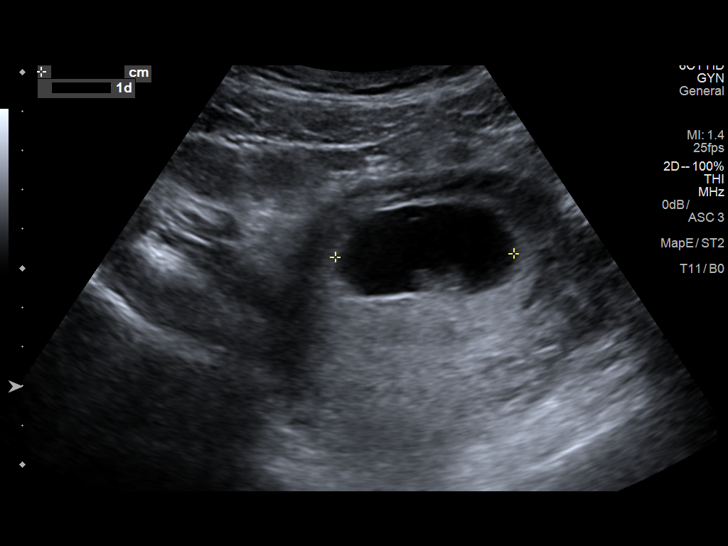
[im 10/86]
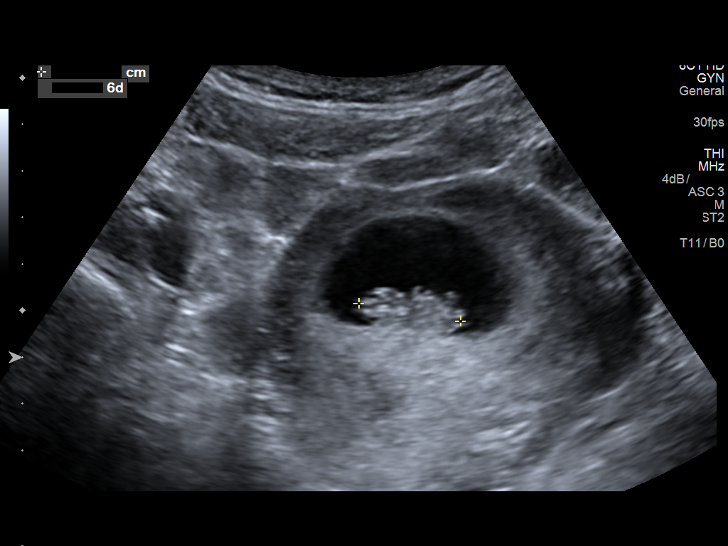
[im 16/86]
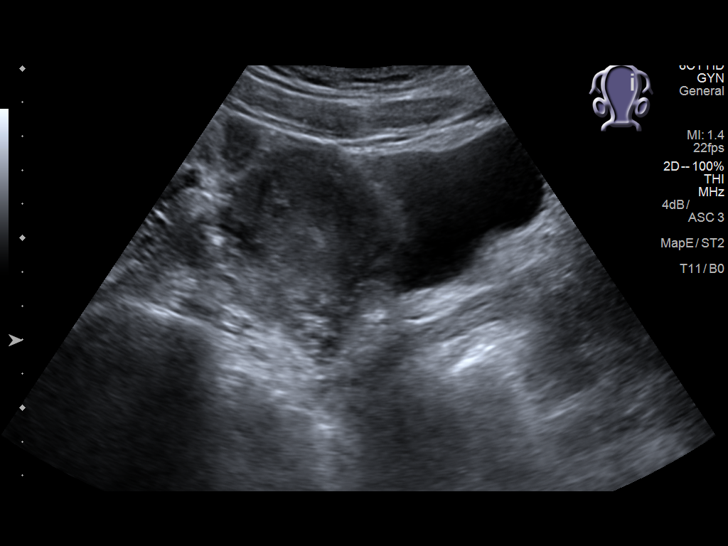
[im 23/86]
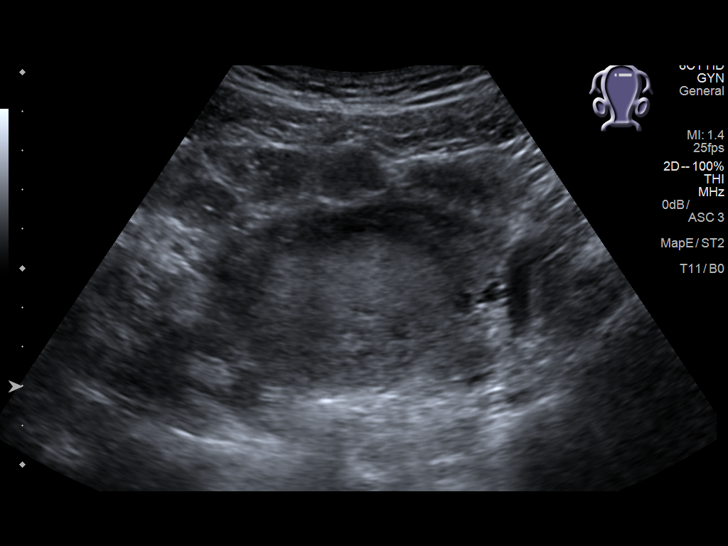
[im 29/86]
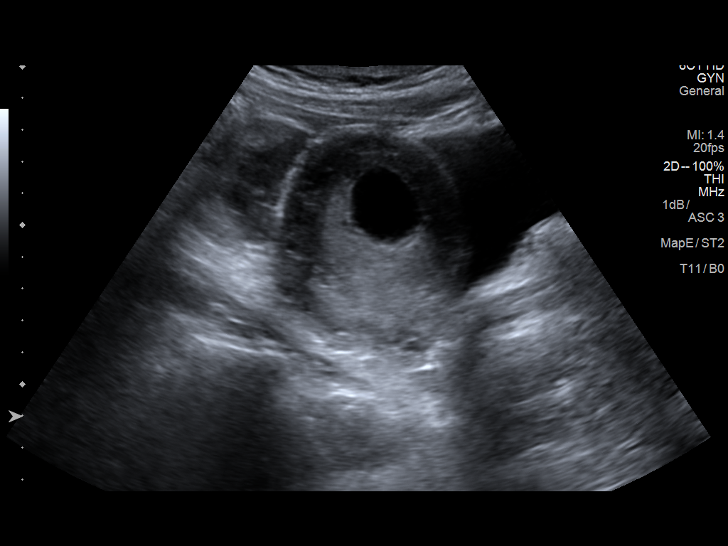
[im 35/86]
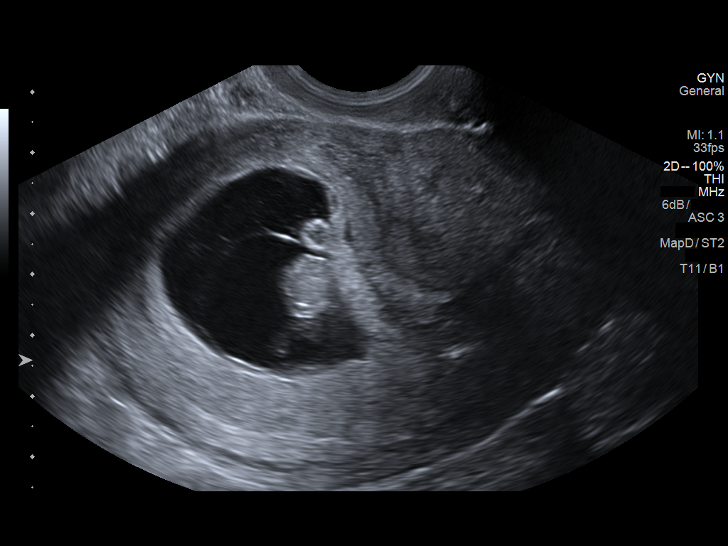
[im 45/86]
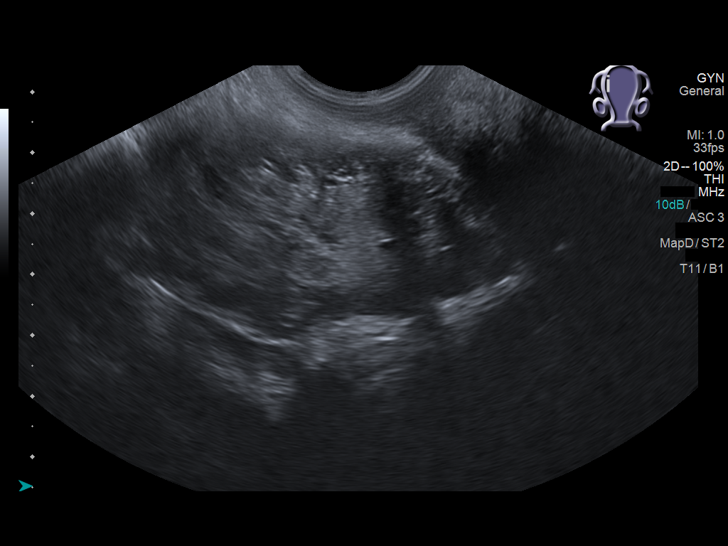
[im 51/86]
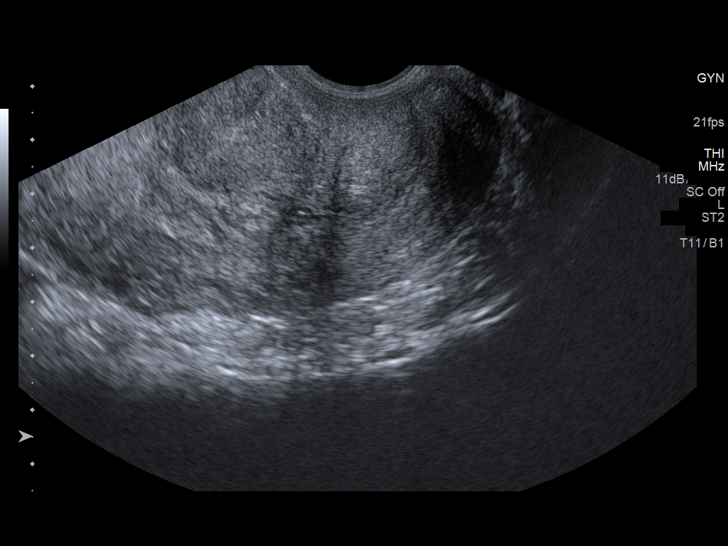
[im 57/86]
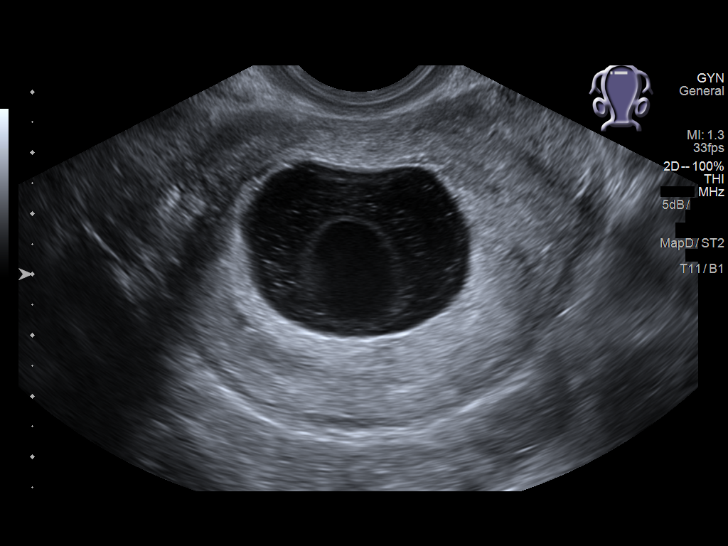
[im 63/86]
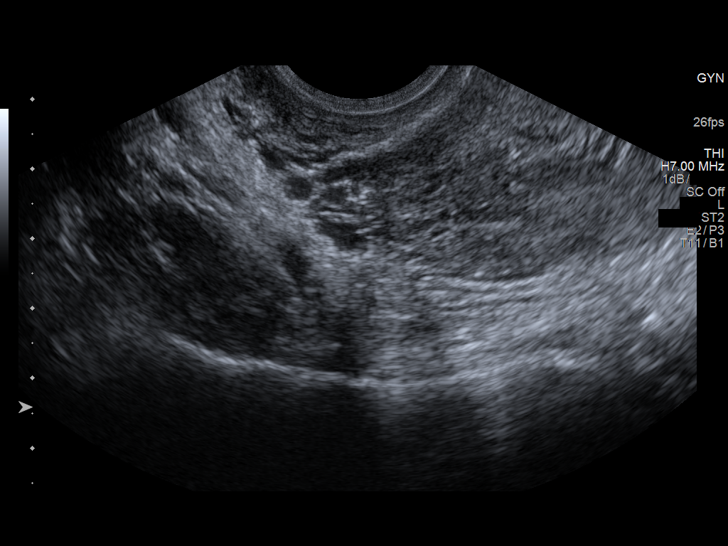
[im 70/86]
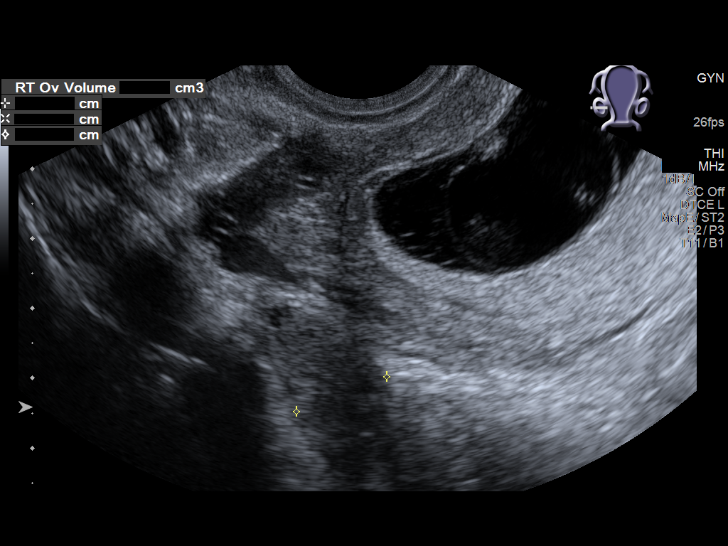
[im 76/86]
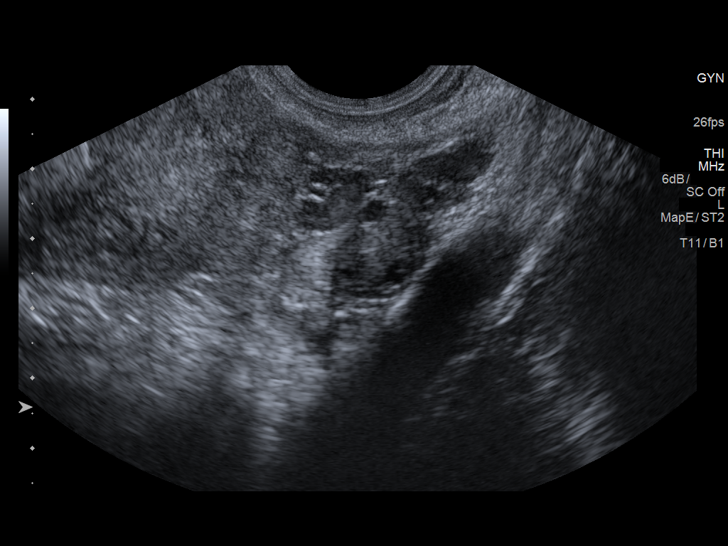
[im 82/86]
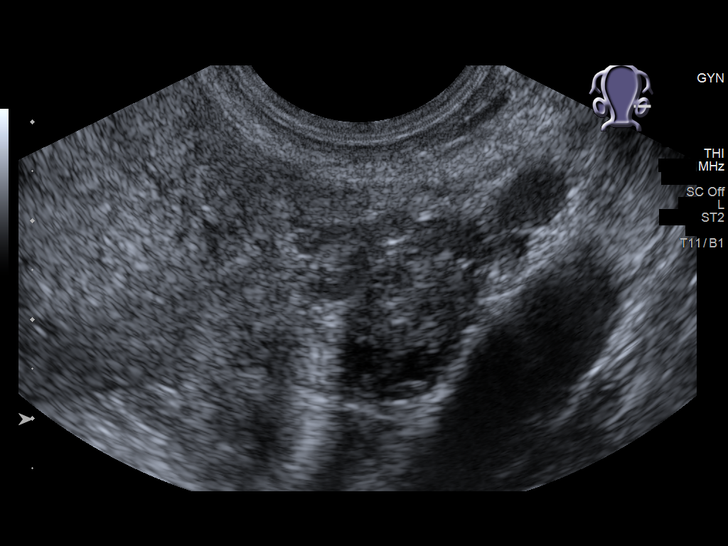

[13 of 28 positions shown; findings below may reference images not displayed]

FINDINGS: Intrauterine gestational sac: A single intrauterine gestational sac
is present.

Yolk sac:  The yolk sac is visualized.

Embryo:  Fetal pole is identified.

Cardiac Activity: Fetal cardiac activity is observed.

Heart Rate: 178  bpm

CRL:  23  mm   9 w   0 d                  US EDC: 01/13/2018

Subchorionic hemorrhage: A small subchorionic hemorrhage is
identified anterior/inferior.

Maternal uterus/adnexae: The uterus is anteverted. No myometrial
mass lesions are identified. Both ovaries are identified and appear
normal. No abnormal adnexal masses. No free fluid in the pelvis.
IMPRESSION: A single intrauterine pregnancy is present. Estimated gestational
age based on crown-rump length is 9 weeks 0 days. A small
subchorionic hemorrhage is identified.

## 2019-07-03 ENCOUNTER — Other Ambulatory Visit: Payer: Self-pay | Admitting: *Deleted

## 2019-07-03 DIAGNOSIS — Z20822 Contact with and (suspected) exposure to covid-19: Secondary | ICD-10-CM

## 2019-07-04 LAB — NOVEL CORONAVIRUS, NAA: SARS-CoV-2, NAA: NOT DETECTED

## 2019-07-06 ENCOUNTER — Telehealth: Payer: Self-pay | Admitting: General Practice

## 2019-07-06 NOTE — Telephone Encounter (Signed)
Negative COVID results given. Patient results "NOT Detected." Caller expressed understanding. ° °

## 2019-11-18 ENCOUNTER — Telehealth: Payer: Self-pay | Admitting: Physician Assistant

## 2019-11-18 ENCOUNTER — Other Ambulatory Visit: Payer: Self-pay | Admitting: Family Medicine

## 2019-11-18 NOTE — Telephone Encounter (Signed)
Discussed pt request and [facility] hx with provider.  Phone call to pt. Pt accepts [facility] appt to get PAP and Acyclovir Rx for 1 year (declines STD appt). Pt requests refill for a month to be sent to [facility] on [address] until she comes in for [facility] appt on 11/23/2019.

## 2019-11-18 NOTE — Telephone Encounter (Signed)
needs more refill on medication for herpes, is also needing to refill at new pharmacy since old one has close. New paharmacy would be [facility] on [address].

## 2019-11-18 NOTE — Telephone Encounter (Signed)
Per Centricity: PP visit at [facility] 03/06/2018; 12/26/2017 Rx sent to [facility] for Acyclovir 400 mg 1 tab po BID for suppressive therapy. Pt requesting Acyclovir refill.  Please let me know what you want me to tell pt. Thank you.

## 2019-11-18 NOTE — Telephone Encounter (Signed)
Call to [facility] pharmacy per patient request.  Rx for Acyclovir 800mg  #30 1 po daily with no refills called and left on provider voicemail.  Patient to have this refill to last until has her appt in [facility] on 11/23/2019 for pap and further refills.

## 2019-11-20 DIAGNOSIS — A6000 Herpesviral infection of urogenital system, unspecified: Secondary | ICD-10-CM

## 2019-11-23 ENCOUNTER — Ambulatory Visit: Payer: Medicaid Other | Admitting: Family Medicine

## 2019-11-23 ENCOUNTER — Encounter: Payer: Self-pay | Admitting: Family Medicine

## 2019-11-23 ENCOUNTER — Other Ambulatory Visit: Payer: Self-pay

## 2019-11-23 VITALS — BP 114/79 | Ht 62.5 in | Wt 149.6 lb

## 2019-11-23 DIAGNOSIS — Z113 Encounter for screening for infections with a predominantly sexual mode of transmission: Secondary | ICD-10-CM

## 2019-11-23 DIAGNOSIS — Z3009 Encounter for other general counseling and advice on contraception: Secondary | ICD-10-CM

## 2019-11-23 DIAGNOSIS — A6000 Herpesviral infection of urogenital system, unspecified: Secondary | ICD-10-CM

## 2019-11-23 LAB — WET PREP FOR TRICH, YEAST, CLUE
Trichomonas Exam: NEGATIVE
Yeast Exam: NEGATIVE

## 2019-11-23 MED ORDER — THERA VITAL M PO TABS: 1.0000 | ORAL_TABLET | Freq: Every day | ORAL | 0 refills | Status: AC

## 2019-11-23 MED ORDER — ACYCLOVIR 400 MG PO TABS: 400.0000 mg | ORAL_TABLET | Freq: Two times a day (BID) | ORAL | 2 refills | Status: AC

## 2019-11-23 NOTE — Progress Notes (Signed)
Family Planning Visit  Subjective:  Martha Tucker is a 29 y.o. being seen today for  Chief Complaint  Patient presents with  . Contraception    Pap    Pt has Genital herpes simplex type 2 on their problem list.  HPI  Patient reports she is here for a pap, no hx of abnormal pap. Also requests suppressive tx for HSV, ran out of this 3 wks ago. Last HSV outbreak was 2 yrs ago.   Also reports itching, white clumpy discharge x3-4 wks. Would like STI testing.  No LMP recorded. Patient has had an implant. BCM: nexplanon Pt desires EC? n/a  Last pap: 2016 - negative  Last breast exam: 1+ yrs  Patient reports 1 partner(s) in last year. Do they desire STI screening (if no, why not)? Yes - but declines HIV and syphilis.  Does the patient desire a pregnancy in the next year? no   29 y.o., Body mass index is 26.93 kg/m. - Is patient eligible for HA1C diabetes screening based on BMI and age >43?  no  Does the patient have a current or past history of drug use? no No components found for: HCV  See flowsheet for other program required questions.   Health Maintenance Due  Topic Date Due  . TETANUS/TDAP  Never done  . PAP-Cervical Cytology Screening  09/30/2017  . PAP SMEAR-Modifier  09/30/2017  . INFLUENZA VACCINE  Never done    ROS  The following portions of the patient's history were reviewed and updated as appropriate: allergies, current medications, past family history, past medical history, past social history, past surgical history and problem list. Problem list updated.  Objective:  BP 114/79   Ht 5' 2.5" (1.588 m)   Wt 149 lb 9.6 oz (67.9 kg)   Breastfeeding No   BMI 26.93 kg/m    Physical Exam  Gen: well appearing, NAD HEENT: no scleral icterus Breasts:        Right: Normal. No swelling, mass, nipple discharge, skin change or tenderness.        Left: Normal. No swelling, mass, nipple discharge, skin change or tenderness.  Lung: Normal WOB Ext: well  perfused, no edema  PELVIC: Normal appearing external genitalia; normal appearing vaginal mucosa and cervix.  +white discharge, ph<4.5.  Pap smear obtained.  Normal uterine size, no other palpable masses, no uterine or adnexal tenderness.   Assessment and Plan:  Martha Tucker is a 29 y.o. female presenting to the [facility] Department for a well woman exam/family planning visit  Contraception counseling: Reviewed all forms of birth control options in the tiered based approach including abstinence; over the counter/barrier methods; hormonal contraceptive medication including pill, patch, ring, injection, contraceptive implant; hormonal and nonhormonal IUDs; permanent sterilization options including vasectomy and the various tubal sterilization modalities. Risks, benefits, how to discontinue and typical effectiveness rates were reviewed.  Questions were answered.  Written information was also given to the patient to review.  Patient desires continue NExplanon, this was prescribed for patient. She will follow up in  1 year for surveillance.  She was told to call with any further questions, or with any concerns about this method of contraception.  Emphasized use of condoms 100% of the time for STI prevention.  Emergency Contraception: n/a   1. Family planning services -Pap today. -Pt requests MV. - IGP, rfx Aptima HPV ASCU  2. Screening examination for venereal disease -Screenings today as below. Treat wet prep per standing order. -Patient does  not meet criteria for HepB, HepC Screening. Declines HIV and syphilis screenings. -Counseled on warning s/sx and when to seek care. Recommended condom use with all sex and discussed importance of condom use for STI prevention. - WET PREP FOR [place], YEAST, [facility]  3. Genital herpes simplex type 2 -Rx acyclovir suppressive tx to pt's pharmacy - acyclovir (ZOVIRAX) 400 MG tablet; Take 1 tablet (400 mg  total) by mouth 2 (two) times daily.  Dispense: 240 tablet; Refill: 2  Return in about 1 year (around 11/22/2020) for yearly wellness exam.  No future appointments.  Kandee Keen, PA-C

## 2019-11-23 NOTE — Progress Notes (Signed)
In for pap; declines HIV/RPR testing Sharlette Dense, RN Wet prep reviewed-no Tx indicated Sharlette Dense, RN

## 2019-11-25 LAB — IGP, RFX APTIMA HPV ASCU: PAP Smear Comment: 0

## 2020-11-23 ENCOUNTER — Ambulatory Visit (LOCAL_COMMUNITY_HEALTH_CENTER): Payer: Medicaid Other | Admitting: Advanced Practice Midwife

## 2020-11-23 ENCOUNTER — Encounter: Payer: Self-pay | Admitting: Advanced Practice Midwife

## 2020-11-23 ENCOUNTER — Other Ambulatory Visit: Payer: Self-pay

## 2020-11-23 VITALS — BP 117/78 | HR 64 | Ht 62.5 in | Wt 146.0 lb

## 2020-11-23 DIAGNOSIS — Z3046 Encounter for surveillance of implantable subdermal contraceptive: Secondary | ICD-10-CM | POA: Diagnosis not present

## 2020-11-23 DIAGNOSIS — Z3009 Encounter for other general counseling and advice on contraception: Secondary | ICD-10-CM

## 2020-11-23 DIAGNOSIS — Z1388 Encounter for screening for disorder due to exposure to contaminants: Secondary | ICD-10-CM | POA: Diagnosis not present

## 2020-11-23 DIAGNOSIS — A6000 Herpesviral infection of urogenital system, unspecified: Secondary | ICD-10-CM

## 2020-11-23 DIAGNOSIS — Z0389 Encounter for observation for other suspected diseases and conditions ruled out: Secondary | ICD-10-CM | POA: Diagnosis not present

## 2020-11-23 DIAGNOSIS — O99213 Obesity complicating pregnancy, third trimester: Secondary | ICD-10-CM | POA: Insufficient documentation

## 2020-11-23 DIAGNOSIS — E663 Overweight: Secondary | ICD-10-CM

## 2020-11-23 DIAGNOSIS — F17200 Nicotine dependence, unspecified, uncomplicated: Secondary | ICD-10-CM | POA: Insufficient documentation

## 2020-11-23 LAB — WET PREP FOR TRICH, YEAST, CLUE
Trichomonas Exam: NEGATIVE
Yeast Exam: NEGATIVE

## 2020-11-23 MED ORDER — VALACYCLOVIR HCL 500 MG PO TABS: 500.0000 mg | ORAL_TABLET | Freq: Two times a day (BID) | ORAL | 12 refills | Status: DC

## 2020-11-23 NOTE — Progress Notes (Signed)
[facility] [address] Main Number: [phone]    Family Planning Visit- Initial Visit  Subjective:  Martha Tucker is a 30 y.o. SBF smoker  G2P2002 (8, 2.5)  being seen today for an initial annual visit and to discuss contraceptive options.  The patient is currently using Nexplanon for pregnancy prevention. Patient reports she does not want a pregnancy in the next year.  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  Chief Complaint  Patient presents with  . Annual Exam    Patient reports here for physical.  LMP 2019.  Last sex 11/19/20 with condom; with current partner x 6 years; 1 partner in last 3 mo.  Smoker 1/2-3/4 ppd.  Last MJ 2019. Last ETOH 11/19/20 (1 bottle wine) q weekend.  Last pap 11/23/19 neg with PE.  Nexplanon placed 12/2017 after birth in hospital pp. Pt requesting to change to episodic HSV tx and wants rx for same. Has HSV outbreak q 3-4 years.  Working prn 20 hrs/wk, not in school.  Liiving with her 2 kids.  Patient denies vaping, cigars  Body mass index is 26.28 kg/m. - Patient is eligible for diabetes screening based on BMI and age >56?  not applicable HA1C ordered? not applicable  Patient reports 1  partner/s in last year. Desires STI screening?  Yes  Has patient been screened once for HCV in the past?  No  No results found for: HCVAB  Does the patient have current drug use (including MJ), have a partner with drug use, and/or has been incarcerated since last result? No  If yes-- Screen for HCV through [facility]   Does the patient meet criteria for HBV testing? No  Criteria:  -Household, sexual or needle sharing contact with HBV -History of drug use -HIV positive -Those with known Hep C   Health Maintenance Due  Topic Date Due  . Hepatitis C Screening  Never done  . COVID-19 Vaccine (1) Never done  .  TETANUS/TDAP  Never done  . INFLUENZA VACCINE  Never done    Review of Systems  All other systems reviewed and are negative.   The following portions of the patient's history were reviewed and updated as appropriate: allergies, current medications, past family history, past medical history, past social history, past surgical history and problem list. Problem list updated.   See flowsheet for other program required questions.  Objective:   Vitals:   11/23/20 0848  BP: 117/78  Pulse: 64  Weight: 146 lb (66.2 kg)  Height: 5' 2.5" (1.588 m)    Physical Exam Constitutional:      Appearance: Normal appearance. She is normal weight.  HENT:     Head: Normocephalic and atraumatic.     Comments: Thyroid without masses or enlargement Negative cervical lymphadenopathy    Mouth/Throat:     Mouth: Mucous membranes are moist.     Comments: Has dental exam 2x/year No visible caries  Eyes:     Conjunctiva/sclera: Conjunctivae normal.  Cardiovascular:     Rate and Rhythm: Normal rate and regular rhythm.  Pulmonary:     Effort: Pulmonary effort is normal.     Breath sounds: Normal breath sounds.  Chest:  Breasts:     Right: Normal.     Left: Normal.    Abdominal:     Palpations: Abdomen is soft.     Comments: Poor tone,  soft without masses or tenderness  Genitourinary:    General: Normal vulva.     Exam position: Lithotomy position.     Rectum: Normal.  Musculoskeletal:        General: Normal range of motion.     Cervical back: Normal range of motion and neck supple.  Skin:    General: Skin is warm and dry.  Neurological:     Mental Status: She is alert.  Psychiatric:        Mood and Affect: Mood normal.       Assessment and Plan:  Martha Tucker is a 30 y.o. female presenting to the [facility] for an initial annual wellness/contraceptive visit  Contraception counseling: Reviewed all forms of birth control options in the tiered based  approach. available including abstinence; over the counter/barrier methods; hormonal contraceptive medication including pill, patch, ring, injection,contraceptive implant, ECP; hormonal and nonhormonal IUDs; permanent sterilization options including vasectomy and the various tubal sterilization modalities. Risks, benefits, and typical effectiveness rates were reviewed.  Questions were answered.  Written information was also given to the patient to review.  Patient desires continuation of Nexplanon, this was prescribed for patient. She will follow up in  prn for surveillance.  She was told to call with any further questions, or with any concerns about this method of contraception.  Emphasized use of condoms 100% of the time for STI prevention.  Patient was offered ECP. ECP was not accepted by the patient. ECP counseling was not given - see RN documentation  1. Overweight BMI=26.2   2. Family planning Treat wet mount per standing orders Immunization nurse consult Please give 1800# to pt  - WET PREP FOR TRICH, YEAST, CLUE - Syphilis Serology, [place] [facility] - Chlamydia/Gonorrhea [place] [facility] - HIV  LAB  3. Encounter for surveillance of implantable subdermal contraceptive Pt to return 12/2020 or later for removal/reinsertion  4. Genital herpes simplex type 2 Pt desires to change to episodic HSV tx because has outbreaks q 3-4 years. Pt desires written rx for Valcyclovir 500 mg BID x 3 days with HSV outbreak with RF x1 year     No follow-ups on file.  No future appointments.  Alberteen Spindle, CNM

## 2020-11-23 NOTE — Progress Notes (Signed)
Wet mount reviewed and no treatment indicated per standing order. Jossie Ng, RN

## 2020-11-29 LAB — HM HIV SCREENING LAB: HM HIV Screening: NEGATIVE

## 2021-03-08 ENCOUNTER — Other Ambulatory Visit: Payer: Self-pay

## 2021-03-08 ENCOUNTER — Encounter: Payer: Self-pay | Admitting: Family Medicine

## 2021-03-08 ENCOUNTER — Ambulatory Visit (LOCAL_COMMUNITY_HEALTH_CENTER): Payer: Medicaid Other | Admitting: Family Medicine

## 2021-03-08 VITALS — BP 112/79 | HR 71 | Resp 18 | Ht 61.0 in | Wt 141.0 lb

## 2021-03-08 DIAGNOSIS — Z3046 Encounter for surveillance of implantable subdermal contraceptive: Secondary | ICD-10-CM

## 2021-03-08 DIAGNOSIS — Z3009 Encounter for other general counseling and advice on contraception: Secondary | ICD-10-CM | POA: Diagnosis not present

## 2021-03-08 NOTE — Progress Notes (Signed)
Patient here for Nexplanon removal after having device in for 3 years. Patient instructions given and verbalized understanding.   Patient states she will use condoms as a BCM "while I takes a break and let my body breath" but will call for Nexplanon insertion appointment.  Informed to not have sex 2 weeks prior to Nexplanon insertion, condoms given during this visit.   Floy Sabina, RN

## 2021-03-08 NOTE — Progress Notes (Signed)
S: patient In clinic for Nexplanon removal.  Wants to take break from [facility].    O: Nexplanon placed on 01/17/2018, last PE was 11/23/20 @ [facility]  A: 1. Nexplanon removal  P: Patient identified, informed consent performed, consent signed.   Appropriate time out taken. Nexplanon site identified.  Area prepped in usual sterile fashon. 3 ml of 1% lidocaine with Epinephrine was used to anesthetize the area at the distal end of the implant and along implant site. A small stab incision was made right beside the implant on the distal portion.  The Nexplanon rod was grasped using hemostats and removed without difficulty.  There was minimal blood loss. There were no complications.  Steri-strips were applied over the small incision.  A pressure bandage was applied to reduce any bruising.  The patient tolerated the procedure well and was given post procedure instructions.    Counseled patient to take OTC analgesic starting as soon as lidocaine starts to wear off and take regularly for at least 48 hr to decrease discomfort.  Specifically to take with food or milk to decrease stomach upset and for IB 600 mg (3 tablets) every 6 hrs; IB 800 mg (4 tablets) every 8 hrs; or Aleve 2 tablets every 12 hrs.   Patient to RTC when ready for [facility].    Wendi Snipes, FNP

## 2022-01-02 ENCOUNTER — Ambulatory Visit: Payer: Medicaid Other

## 2022-01-18 ENCOUNTER — Ambulatory Visit (LOCAL_COMMUNITY_HEALTH_CENTER): Payer: Medicaid Other | Admitting: Family Medicine

## 2022-01-18 ENCOUNTER — Encounter: Payer: Self-pay | Admitting: Family Medicine

## 2022-01-18 VITALS — BP 124/87 | Ht 61.0 in | Wt 139.0 lb

## 2022-01-18 DIAGNOSIS — Z3009 Encounter for other general counseling and advice on contraception: Secondary | ICD-10-CM

## 2022-01-18 DIAGNOSIS — Z3169 Encounter for other general counseling and advice on procreation: Secondary | ICD-10-CM | POA: Diagnosis not present

## 2022-01-18 DIAGNOSIS — Z0000 Encounter for general adult medical examination without abnormal findings: Secondary | ICD-10-CM

## 2022-01-18 DIAGNOSIS — A6000 Herpesviral infection of urogenital system, unspecified: Secondary | ICD-10-CM

## 2022-01-18 MED ORDER — PRENATAL VITAMIN 27-0.8 MG PO TABS: 1.0000 | ORAL_TABLET | ORAL | 0 refills | Status: AC

## 2022-01-18 MED ORDER — VALACYCLOVIR HCL 500 MG PO TABS: 500.0000 mg | ORAL_TABLET | Freq: Two times a day (BID) | ORAL | 12 refills | Status: DC

## 2022-01-18 NOTE — Progress Notes (Addendum)
[facility] DEPARTMENT [facility] [address] Main Number: [phone]  Family Planning Visit- Repeat Yearly Visit  Subjective:  Martha Tucker is a 31 y.o. G2P2002  being seen today for an annual wellness visit and to discuss contraception options.   The patient is currently using No Method - No Contraceptive Precautions for pregnancy prevention. Patient does want a pregnancy in the next year.    report they are looking for a method that provides Other Not looking for a method    Patient has the following medical problems: has Genital herpes simplex type 2; Overweight BMI=26.2; and Smoker 1/2-3/4 ppd on their problem list.  Chief Complaint  Patient presents with   Annual Exam    Patient reports here for physical, desires pregnancy   Patient denies any other concerns.    See flowsheet for other program required questions.   Body mass index is 26.26 kg/m. - Patient is eligible for diabetes screening based on BMI and age >20?  not applicable HA1C ordered? not applicable  Patient reports 1 of partners in last year. Desires STI screening?  No - declined    Has patient been screened once for HCV in the past?  No  No results found for: HCVAB  Does the patient have current of drug use, have a partner with drug use, and/or has been incarcerated since last result? No  If yes-- Screen for HCV through [facility]   Does the patient meet criteria for HBV testing? Yes  Criteria:  -Household, sexual or needle sharing contact with HBV -History of drug use -HIV positive -Those with known Hep C   Health Maintenance Due  Topic Date Due   COVID-19 Vaccine (1) Never done   Hepatitis C Screening  Never done    ROS  The following portions of the patient's history were reviewed and updated as appropriate: allergies, current medications, past family history, past medical history, past social history, past surgical history and problem  list. Problem list updated.  Objective:   Vitals:   01/18/22 0919  BP: 124/87  Weight: 139 lb (63 kg)  Height: 5\' 1"  (1.549 m)    Physical Exam Vitals and nursing note reviewed.  Constitutional:      Appearance: Normal appearance.  HENT:     Head: Normocephalic and atraumatic.     Mouth/Throat:     Mouth: Mucous membranes are moist.     Dentition: Normal dentition. No dental caries.     Pharynx: No oropharyngeal exudate or posterior oropharyngeal erythema.  Eyes:     General: No scleral icterus. Neck:     Thyroid: No thyroid mass, thyromegaly or thyroid tenderness.  Cardiovascular:     Rate and Rhythm: Normal rate and regular rhythm.     Pulses: Normal pulses.     Heart sounds: Normal heart sounds.  Pulmonary:     Effort: Pulmonary effort is normal.     Breath sounds: Normal breath sounds.  Chest:  Breasts:    Tanner Score is 5.     Breasts are symmetrical.     Right: Normal.     Left: Normal.  Abdominal:     General: Abdomen is flat. Bowel sounds are normal.     Palpations: Abdomen is soft.  Genitourinary:    Comments: Deferred  Musculoskeletal:        General: Normal range of motion.     Cervical back: Normal range of motion and neck supple.  Skin:  General: Skin is warm and dry.  Neurological:     General: No focal deficit present.     Mental Status: She is alert and oriented to person, place, and time.  Psychiatric:        Mood and Affect: Mood normal.        Behavior: Behavior normal.      Assessment and Plan:  [name] is a 31 y.o. female G2P2002 presenting to the [facility] Department for an yearly wellness and contraception visit      1. Family planning Contraception counseling: Reviewed options based on patient desire and reproductive life plan. Patient is interested in No Method - No Contraceptive Precautions. This was provided to the patient today.   Risks, benefits, and typical effectiveness rates were reviewed.   Questions were answered.  Written information was also given to the patient to review.    The patient will follow up in  as needed for surveillance.  The patient was told to call with any further questions, or with any concerns about this method of contraception.  Emphasized use of condoms 100% of the time for STI prevention.  Patient was assessed for need for ECP. Patient was not offered ECP based on desired.  Patient is within 2 days of unprotected sex. Patient was offered ECP. Reviewed options and patient desired No method of ECP, declined all     2. Routine general medical examination at a health care facility Well woman exam  Pap due 2024  CBE due 2025  3. Encounter for preconception consultation Information given for achieving a health pregnancy  Discussed smoking cessation  PNV given and march of dimes booklet given   - Prenatal Vit-Fe Fumarate-FA (PRENATAL VITAMIN) 27-0.8 MG TABS; Take 1 tablet by mouth 1 day or 1 dose for 1 dose.  Dispense: 100 tablet; Refill: 0  4. Genital herpes simplex type 2 RX sent to listed Pharmacy  - valACYclovir (VALTREX) 500 MG tablet; Take 1 tablet (500 mg total) by mouth 2 (two) times daily.  Dispense: 6 tablet; Refill: 12      Return in about 1 year (around 01/19/2023) for annual well woman exam.  No future appointments.  Martha Snipes, FNP

## 2022-01-18 NOTE — Progress Notes (Signed)
Patient seen for PE. Patient seeking pregnancy. Prenatal vitamins given. Condoms given. PCP list given.

## 2022-07-27 DIAGNOSIS — Z419 Encounter for procedure for purposes other than remedying health state, unspecified: Secondary | ICD-10-CM | POA: Diagnosis not present

## 2022-08-27 DIAGNOSIS — Z419 Encounter for procedure for purposes other than remedying health state, unspecified: Secondary | ICD-10-CM | POA: Diagnosis not present

## 2022-09-22 ENCOUNTER — Other Ambulatory Visit: Payer: Self-pay

## 2022-09-22 ENCOUNTER — Emergency Department
Admission: EM | Admit: 2022-09-22 | Discharge: 2022-09-22 | Disposition: A | Discharge status: Home / Self Care | Payer: Medicaid Other | Attending: Emergency Medicine | Admitting: Emergency Medicine

## 2022-09-22 DIAGNOSIS — R059 Cough, unspecified: Secondary | ICD-10-CM | POA: Diagnosis not present

## 2022-09-22 DIAGNOSIS — Z20822 Contact with and (suspected) exposure to covid-19: Secondary | ICD-10-CM | POA: Diagnosis not present

## 2022-09-22 DIAGNOSIS — F17210 Nicotine dependence, cigarettes, uncomplicated: Secondary | ICD-10-CM | POA: Diagnosis not present

## 2022-09-22 DIAGNOSIS — J111 Influenza due to unidentified influenza virus with other respiratory manifestations: Secondary | ICD-10-CM

## 2022-09-22 DIAGNOSIS — J101 Influenza due to other identified influenza virus with other respiratory manifestations: Secondary | ICD-10-CM | POA: Diagnosis not present

## 2022-09-22 LAB — RESP PANEL BY RT-PCR (RSV, FLU A&B, COVID)  RVPGX2
Influenza A by PCR: NEGATIVE
Influenza B by PCR: POSITIVE — AB
Resp Syncytial Virus by PCR: NEGATIVE
SARS Coronavirus 2 by RT PCR: NEGATIVE

## 2022-09-22 MED ORDER — KETOROLAC TROMETHAMINE 30 MG/ML IJ SOLN
30.0000 mg | Freq: Once | INTRAMUSCULAR | Status: AC
  Administered 2022-09-22: 30 mg via INTRAMUSCULAR
  Filled 2022-09-22: qty 1

## 2022-09-22 NOTE — ED Provider Notes (Signed)
[facility] Department Provider Note   ____________________________________________   Event Date/Time   First MD Initiated Contact with Patient 09/22/22 1529     (approximate)  I have reviewed the triage vital signs and the nursing notes.   HISTORY  Chief Complaint Cough and Generalized Body Aches    HPI Martha Tucker is a 31 y.o. female presents to the emergency room for complaint of upper respiratory infection symptoms.  She reports that for the last 4 days she has had cough, runny nose, nasal congestion, severe body aches, fever, headaches.  She denies nausea/vomiting/diarrhea, sore throat, chest pain, shortness of breath.  She reports that her fever only lasted for the first 24 hours of illness and that has now resolved.  She reports that she has been exposed to her 63-year-old son that has been sick for the past week.  Her main concern today is that she has a congested cough and severe body aches with a headache that will not find relief with over-the-counter medications.  Patient has attempted to take over-the-counter cold medications as well as Tylenol/ibuprofen without relief of symptoms.   Past Medical History:  Diagnosis Date   Genital herpes    History of abnormal cervical Pap smear     Patient Active Problem List   Diagnosis Date Noted   Overweight BMI=26.2 11/23/2020   Smoker 1/2-3/4 ppd 11/23/2020   Genital herpes simplex type 2 06/13/2017    Past Surgical History:  Procedure Laterality Date   TONSILLECTOMY      Prior to Admission medications   Medication Sig Start Date End Date Taking? Authorizing Provider  etonogestrel (NEXPLANON) 68 MG IMPL implant 1 each by Subdermal route once. Patient not taking: Reported on 01/18/2022 01/16/18   [facility]  Multiple Vitamins-Minerals (MULTIVITAMIN WITH MINERALS) tablet Take 1 tablet by mouth daily. Patient not taking: Reported on 01/18/2022    [provider]  valACYclovir (VALTREX) 500 MG tablet Take 1 tablet (500 mg total) by mouth 2 (two) times daily. 01/18/22   Wendi Snipes, FNP    Allergies Patient has no known allergies.  Family History  Problem Relation Age of Onset   Cancer Paternal Grandfather    CVA Paternal Grandmother    Kidney disease Father    Arthritis Father    Hypertension Mother    Arthritis Mother    Breast cancer Neg Hx     Social History Social History   Tobacco Use   Smoking status: Every Day    Packs/day: 1.00    Years: 15.00    Total pack years: 15.00    Types: Cigarettes   Smokeless tobacco: Never  Vaping Use   Vaping Use: Never used  Substance Use Topics   Alcohol use: Not Currently    Alcohol/week: 6.0 standard drinks of alcohol    Types: 6 Glasses of wine per week    Comment: Last ETOH use weekend of 11/19/2020 (1 bottle wine)   Drug use: Not Currently    Types: Marijuana    Comment: Last use -  marijuana use 2019    Review of Systems  Constitutional: Positive fever/chills. Eyes: No visual changes. ENT: No sore throat. Cardiovascular: Denies chest pain. Respiratory: Denies shortness of breath.  Positive cough Gastrointestinal: No abdominal pain.  No nausea, no vomiting.  No diarrhea.  No constipation. Genitourinary: Negative for dysuria. Musculoskeletal: Positive body aches Skin: Negative for rash. Neurological: Negative focal weakness or numbness.  Positive headache  ____________________________________________   PHYSICAL EXAM:  VITAL SIGNS: ED Triage Vitals [09/22/22 1507]  Enc Vitals Group     BP (!) 147/117     Pulse Rate 100     Resp 20     Temp 98.5 F (36.9 C)     Temp Source Oral     SpO2 99 %     Weight 146 lb (66.2 kg)     Height 5\' 1"  (1.549 m)     Head Circumference      Peak Flow      Pain Score 5     Pain Loc      Pain Edu?      Excl. in [place]?     Constitutional: Alert and oriented. Well appearing and in no acute distress. Eyes:  Conjunctivae are normal. PERRL. EOMI. Head: Atraumatic. Nose: Positive nasal congestion and rhinorrhea. Mouth/Throat: Mucous membranes are moist.  Oropharynx erythematous.  Tonsillar enlargement noted. Neck: No stridor.   Hematological/Lymphatic/Immunilogical: No cervical lymphadenopathy. Cardiovascular: Normal rate, regular rhythm. Grossly normal heart sounds.  Good peripheral circulation. Respiratory: Normal respiratory effort.  No retractions. Lungs CTAB. Gastrointestinal: Soft and nontender. No distention. No abdominal bruits. No CVA tenderness. Musculoskeletal: No lower extremity tenderness nor edema.  No joint effusions. Neurologic:  Normal speech and language. No gross focal neurologic deficits are appreciated. No gait instability. Skin:  Skin is warm, dry and intact. No rash noted. Psychiatric: Mood and affect are normal. Speech and behavior are normal.  ____________________________________________   LABS (all labs ordered are listed, but only abnormal results are displayed)  Labs Reviewed  RESP PANEL BY RT-PCR (RSV, FLU A&B, COVID)  RVPGX2 - Abnormal; Notable for the following components:      Result Value   Influenza B by PCR POSITIVE (*)    All other components within normal limits   ____________________________________________  EKG   ____________________________________________  RADIOLOGY  ED MD interpretation:    Official radiology report(s): No results found.  ____________________________________________   PROCEDURES  Procedure(s) performed: None  Procedures  Critical Care performed: No  ____________________________________________   INITIAL IMPRESSION / ASSESSMENT AND PLAN / ED COURSE     [name] is a 32 y.o. female presents to the emergency room for complaint of upper respiratory infection symptoms.  She reports that for the last 4 days she has had cough, runny nose, nasal congestion, severe body aches, fever, headaches.  She  denies nausea/vomiting/diarrhea, sore throat, chest pain, shortness of breath.  She reports that her fever only lasted for the first 24 hours of illness and that has now resolved.  She reports that she has been exposed to her 48-year-old son that has been sick for the past week.  Her main concern today is that she has a congested cough and severe body aches with a headache that will not find relief with over-the-counter medications.  Patient has attempted to take over-the-counter cold medications as well as Tylenol/ibuprofen without relief of symptoms.   Will give Toradol for headache and obtain respiratory panel.  Patient's respiratory panel has tested positive for influenza B. Based on the fact that she has had symptoms for more than 3 days, Tamiflu would not be warranted. Will discharge patient in stable condition at this time.  She is encouraged to use over-the-counter medication to treat her symptoms.  Patient will need blood pressure rechecked prior to discharge.  If normal she will continue with discharge process. BP 113/87      ____________________________________________   FINAL  CLINICAL IMPRESSION(S) / ED DIAGNOSES  Final diagnoses:  Flu     ED Discharge Orders     None        Note:  This document was prepared using Dragon voice recognition software and may include unintentional dictation errors.     Willaim Rayas, NP 09/22/22 1612    Carrie Mew, MD 09/23/22 [phone]

## 2022-09-22 NOTE — ED Triage Notes (Signed)
Pt to ED via POV from home. Pt reports body aches, cough and congestion for a few days. Pt ambulatory to triage. Pt in NAD.

## 2022-09-22 NOTE — Discharge Instructions (Signed)
You have been seen today in the emergency room and diagnosed with the flu.  Please try to stay well-hydrated and get lots of rest.  Take over-the-counter medications to treat your fever/body aches/other cold symptoms. If your symptoms worsen or do not improve please follow-up with [facility] clinic urgent care.

## 2022-09-27 DIAGNOSIS — Z419 Encounter for procedure for purposes other than remedying health state, unspecified: Secondary | ICD-10-CM | POA: Diagnosis not present

## 2022-10-17 ENCOUNTER — Telehealth: Payer: Self-pay

## 2022-10-17 NOTE — Telephone Encounter (Signed)
Mychart msg sent

## 2022-10-26 DIAGNOSIS — Z419 Encounter for procedure for purposes other than remedying health state, unspecified: Secondary | ICD-10-CM | POA: Diagnosis not present

## 2022-12-01 ENCOUNTER — Other Ambulatory Visit: Payer: Self-pay

## 2022-12-01 ENCOUNTER — Emergency Department
Admission: EM | Admit: 2022-12-01 | Discharge: 2022-12-01 | Disposition: A | Discharge status: Home / Self Care | Payer: 59 | Attending: Emergency Medicine | Admitting: Emergency Medicine

## 2022-12-01 DIAGNOSIS — R509 Fever, unspecified: Secondary | ICD-10-CM | POA: Diagnosis not present

## 2022-12-01 DIAGNOSIS — J020 Streptococcal pharyngitis: Secondary | ICD-10-CM

## 2022-12-01 DIAGNOSIS — Z20822 Contact with and (suspected) exposure to covid-19: Secondary | ICD-10-CM | POA: Insufficient documentation

## 2022-12-01 LAB — RESP PANEL BY RT-PCR (RSV, FLU A&B, COVID)  RVPGX2
Influenza A by PCR: NEGATIVE
Influenza B by PCR: NEGATIVE
Resp Syncytial Virus by PCR: NEGATIVE
SARS Coronavirus 2 by RT PCR: NEGATIVE

## 2022-12-01 LAB — GROUP A STREP BY PCR: Group A Strep by PCR: DETECTED — AB

## 2022-12-01 MED ORDER — AMOXICILLIN 875 MG PO TABS: 875.0000 mg | ORAL_TABLET | Freq: Two times a day (BID) | ORAL | 0 refills | Status: DC

## 2022-12-01 NOTE — ED Notes (Signed)
Patient states a week ago that she started having a sore throat. States lymph nodes are swollen. Will not let me touch her neck. States has a bad headache. Has been taking cold and Flu Tylenol and has not gotten better. States had a chill but did not take temperature. Alert and Oriented x4.

## 2022-12-01 NOTE — ED Provider Notes (Signed)
[facility] Provider Note    Event Date/Time   First MD Initiated Contact with Patient 12/01/22 [phone]     (approximate)   History   flu symptoms   HPI  Martha Tucker is a 32 y.o. female  presents to the ED with 1 week symptoms.  Patient is not aware whether or not she is actually having fever as she is taking Tylenol frequently for her symptoms.  She does report that her lymph nodes do's feel swollen.  She is unaware of any known sick contacts.      Physical Exam   Triage Vital Signs: ED Triage Vitals  Enc Vitals Group     BP 12/01/22 0752 125/89     Pulse Rate 12/01/22 0753 84     Resp 12/01/22 0753 16     Temp 12/01/22 0752 99.1 F (37.3 C)     Temp src --      SpO2 12/01/22 0753 97 %     Weight 12/01/22 0752 143 lb (64.9 kg)     Height 12/01/22 0752 5' (1.524 m)     Head Circumference --      Peak Flow --      Pain Score 12/01/22 0752 8     Pain Loc --      Pain Edu? --      Excl. in [place]? --     Most recent vital signs: Vitals:   12/01/22 0752 12/01/22 0753  BP: 125/89   Pulse:  84  Resp:  16  Temp: 99.1 F (37.3 C)   SpO2:  97%     General: Awake, no distress.  CV:  Good peripheral perfusion.  Resp:  Normal effort.  Clear. Abd:  No distention.  Other:  Posterior pharynx erythematous without exudate.  Uvula is midline.  Neck is supple with minimal cervical lymphadenopathy.  Nasal congestion.   ED Results / Procedures / Treatments   Labs (all labs ordered are listed, but only abnormal results are displayed) Labs Reviewed  GROUP A STREP BY PCR - Abnormal; Notable for the following components:      Result Value   Group A Strep by PCR DETECTED (*)    All other components within normal limits  RESP PANEL BY RT-PCR (RSV, FLU A&B, COVID)  RVPGX2       PROCEDURES:  Critical Care performed:   Procedures   MEDICATIONS ORDERED IN ED: Medications - No data to display   IMPRESSION / MDM / ASSESSMENT AND PLAN /  ED COURSE  I reviewed the triage vital signs and the nursing notes.   Differential diagnosis includes, but is not limited to, viral URI, seasonal allergies, COVID, influenza, strep pharyngitis.  32 year old female presents to the ED with complaint of flulike symptoms along with sore throat and rhinorrhea for 1 week.  Physical exam is positive for erythematous posterior pharynx.  Strep test is positive and patient was made aware.  Respiratory panel is negative.  Patient was given prescription for amoxicillin 875 twice daily for 10 days and she may continue taking Tylenol or ibuprofen as needed for symptoms.  She has to follow-up with her PCP or urgent care if any continued problems.       Patient's presentation is most consistent with acute complicated illness / injury requiring diagnostic workup.  FINAL CLINICAL IMPRESSION(S) / ED DIAGNOSES   Final diagnoses:  Strep pharyngitis     Rx / DC Orders   ED Discharge Orders  Ordered    amoxicillin (AMOXIL) 875 MG tablet  2 times daily        12/01/22 0854             Note:  This document was prepared using Dragon voice recognition software and may include unintentional dictation errors.   Tommi Rumps, PA-C 12/01/22 0277    Shaune Pollack, MD 12/01/22 1009

## 2022-12-01 NOTE — ED Triage Notes (Signed)
Pt to ED for covid/flu symptoms for 1 week. Reports sore throat, ear drainage, runny nose for 1 week. States lymph nodes feel swollen. NAD noted

## 2022-12-01 NOTE — Discharge Instructions (Addendum)
Follow-up with your primary care provider if any continued problems or concerns.  A prescription for amoxicillin was sent to the pharmacy for you to begin taking twice a day for the next 10 days.  Continue taking until completely finished even if you begin feeling much better.  You may take Tylenol or ibuprofen as needed for sore throat, fever, headache or muscle aches.  Also the results of your respiratory panel is back and you do not have COVID or influenza.  As we discussed in 2 days throw your current toothbrush away and be been using a new 1 as the toothbrush you are using now has strep germ on it.  You are considered contagious for 24 hours after starting the antibiotic.  Also you do not have a primary care provider you may follow-up with urgent care.  A list of offices taking new patients is listed on your discharge papers.   Please go to the following website to schedule new (and existing) patient appointments:   [email or web address]   The following is a list of primary care offices in the area who are accepting new patients at this time.  Please reach out to one of them directly and let them know you would like to schedule an appointment to follow up on an Emergency Department visit, and/or to establish a new primary care provider (PCP).  There are likely other primary care clinics in the are who are accepting new patients, but this is an excellent place to start:  [facility] Lead physician: Dr Shirlee Latch [address] [place], [place] [address] [phone]  [facility] Lead Physician: Dr Alba Cory [address], [place], [place] [address] [phone]  [facility]  Lead Physician: Dr Olevia Perches [address] [place], [place] [address] [phone]  [facility] Lead Physician: Dr Sofie Hartigan [address], [place], [place] [address] [phone]  [facility] at [facility] Lead Physician: Dr Bari Edward [address] [place], [place], [place] [address] [phone]

## 2023-01-01 ENCOUNTER — Ambulatory Visit (LOCAL_COMMUNITY_HEALTH_CENTER): Payer: Self-pay

## 2023-01-01 VITALS — BP 131/87 | Ht 60.0 in | Wt 136.0 lb

## 2023-01-01 DIAGNOSIS — Z3201 Encounter for pregnancy test, result positive: Secondary | ICD-10-CM

## 2023-01-01 LAB — PREGNANCY, URINE: Preg Test, Ur: POSITIVE — AB

## 2023-01-01 MED ORDER — PRENATAL VITAMINS 28-0.8 MG PO TABS: 28.0000 mg | ORAL_TABLET | Freq: Every day | ORAL | 0 refills | Status: AC

## 2023-01-01 NOTE — Progress Notes (Signed)
u

## 2023-01-01 NOTE — Progress Notes (Signed)
UPT positive.  Planning prenatal care at [facility] (now [facility]).  Instructed to contact them as soon as possible.  Positive prenatal packet given to pt and reviewed.  Gave Smoking and Pregnancy literature and stressed need to quit smoking.  Pt will followup with DSS regarding medicaid.   The patient was dispensed prenatal vitamins today. I provided counseling today regarding the medication. Patient given the opportunity to ask questions. Questions answered.    Cherlynn Polo, RN

## 2023-01-18 ENCOUNTER — Ambulatory Visit (INDEPENDENT_AMBULATORY_CARE_PROVIDER_SITE_OTHER): Payer: 59

## 2023-01-18 VITALS — Wt 136.0 lb

## 2023-01-18 DIAGNOSIS — Z3480 Encounter for supervision of other normal pregnancy, unspecified trimester: Secondary | ICD-10-CM | POA: Insufficient documentation

## 2023-01-18 DIAGNOSIS — Z130 Encounter for screening for diseases of the blood and blood-forming organs and certain disorders involving the immune mechanism: Secondary | ICD-10-CM

## 2023-01-18 DIAGNOSIS — Z369 Encounter for antenatal screening, unspecified: Secondary | ICD-10-CM

## 2023-01-18 DIAGNOSIS — Z3689 Encounter for other specified antenatal screening: Secondary | ICD-10-CM

## 2023-01-18 NOTE — Patient Instructions (Signed)
First Trimester of Pregnancy  The first trimester of pregnancy starts on the first day of your last menstrual period until the end of week 12. This is also called months 1 through 3 of pregnancy. Body changes during your first trimester Your body goes through many changes during pregnancy. The changes usually return to normal after your baby is born. Physical changes You may gain or lose weight. Your breasts may grow larger and hurt. The area around your nipples may get darker. Dark spots or blotches may develop on your face. You may have changes in your hair. Health changes You may feel like you might vomit (nauseous), and you may vomit. You may have heartburn. You may have headaches. You may have trouble pooping (constipation). Your gums may bleed. Other changes You may get tired easily. You may pee (urinate) more often. Your menstrual periods will stop. You may not feel hungry. You may want to eat certain kinds of food. You may have changes in your emotions from day to day. You may have more dreams. Follow these instructions at home: Medicines Take over-the-counter and prescription medicines only as told by your doctor. Some medicines are not safe during pregnancy. Take a prenatal vitamin that contains at least 600 micrograms (mcg) of folic acid. Eating and drinking Eat healthy meals that include: Fresh fruits and vegetables. Whole grains. Good sources of protein, such as meat, eggs, or tofu. Low-fat dairy products. Avoid raw meat and unpasteurized juice, milk, and cheese. If you feel like you may vomit, or you vomit: Eat 4 or 5 small meals a day instead of 3 large meals. Try eating a few soda crackers. Drink liquids between meals instead of during meals. You may need to take these actions to prevent or treat trouble pooping: Drink enough fluids to keep your pee (urine) pale yellow. Eat foods that are high in fiber. These include beans, whole grains, and fresh fruits and  vegetables. Limit foods that are high in fat and sugar. These include fried or sweet foods. Activity Exercise only as told by your doctor. Most people can do their usual exercise routine during pregnancy. Stop exercising if you have cramps or pain in your lower belly (abdomen) or low back. Do not exercise if it is too hot or too humid, or if you are in a place of great height (high altitude). Avoid heavy lifting. If you choose to, you may have sex unless your doctor tells you not to. Relieving pain and discomfort Wear a good support bra if your breasts are sore. Rest with your legs raised (elevated) if you have leg cramps or low back pain. If you have bulging veins (varicose veins) in your legs: Wear support hose as told by your doctor. Raise your feet for 15 minutes, 3-4 times a day. Limit salt in your food. Safety Wear your seat belt at all times when you are in a car. Talk with your doctor if someone is hurting you or yelling at you. Talk with your doctor if you are feeling sad or have thoughts of hurting yourself. Lifestyle Do not use hot tubs, steam rooms, or saunas. Do not douche. Do not use tampons or scented sanitary pads. Do not use herbal medicines, illegal drugs, or medicines that are not approved by your doctor. Do not drink alcohol. Do not smoke or use any products that contain nicotine or tobacco. If you need help quitting, ask your doctor. Avoid cat litter boxes and soil that is used by cats. These carry   germs that can cause harm to the baby and can cause a loss of your baby by miscarriage or stillbirth. General instructions Keep all follow-up visits. This is important. Ask for help if you need counseling or if you need help with nutrition. Your doctor can give you advice or tell you where to go for help. Visit your dentist. At home, brush your teeth with a soft toothbrush. Floss gently. Write down your questions. Take them to your prenatal visits. Where to find more  information American Pregnancy Association: americanpregnancy.org American College of Obstetricians and Gynecologists: www.acog.org Office on Women's Health: womenshealth.gov/pregnancy Contact a doctor if: You are dizzy. You have a fever. You have mild cramps or pressure in your lower belly. You have a nagging pain in your belly area. You continue to feel like you may vomit, you vomit, or you have watery poop (diarrhea) for 24 hours or longer. You have a bad-smelling fluid coming from your vagina. You have pain when you pee. You are exposed to a disease that spreads from person to person, such as chickenpox, measles, Zika virus, HIV, or hepatitis. Get help right away if: You have spotting or bleeding from your vagina. You have very bad belly cramping or pain. You have shortness of breath or chest pain. You have any kind of injury, such as from a fall or a car crash. You have new or increased pain, swelling, or redness in an arm or leg. Summary The first trimester of pregnancy starts on the first day of your last menstrual period until the end of week 12 (months 1 through 3). Eat 4 or 5 small meals a day instead of 3 large meals. Do not smoke or use any products that contain nicotine or tobacco. If you need help quitting, ask your doctor. Keep all follow-up visits. This information is not intended to replace advice given to you by your health care provider. Make sure you discuss any questions you have with your health care provider. Document Revised: 01/20/2020 Document Reviewed: 11/26/2019 Elsevier Patient Education  2024 Elsevier Inc. Commonly Asked Questions During Pregnancy  Cats: A parasite can be excreted in cat feces.  To avoid exposure you need to have another person empty the little box.  If you must empty the litter box you will need to wear gloves.  Wash your hands after handling your cat.  This parasite can also be found in raw or undercooked meat so this should also be  avoided.  Colds, Sore Throats, Flu: Please check your medication sheet to see what you can take for symptoms.  If your symptoms are unrelieved by these medications please call the office.  Dental Work: Most any dental work your dentist recommends is permitted.  X-rays should only be taken during the first trimester if absolutely necessary.  Your abdomen should be shielded with a lead apron during all x-rays.  Please notify your provider prior to receiving any x-rays.  Novocaine is fine; gas is not recommended.  If your dentist requires a note from us prior to dental work please call the office and we will provide one for you.  Exercise: Exercise is an important part of staying healthy during your pregnancy.  You may continue most exercises you were accustomed to prior to pregnancy.  Later in your pregnancy you will most likely notice you have difficulty with activities requiring balance like riding a bicycle.  It is important that you listen to your body and avoid activities that put you at a higher   risk of falling.  Adequate rest and staying well hydrated are a must!  If you have questions about the safety of specific activities ask your provider.    Exposure to Children with illness: Try to avoid obvious exposure; report any symptoms to us when noted,  If you have chicken pos, red measles or mumps, you should be immune to these diseases.   Please do not take any vaccines while pregnant unless you have checked with your OB provider.  Fetal Movement: After 28 weeks we recommend you do "kick counts" twice daily.  Lie or sit down in a calm quiet environment and count your baby movements "kicks".  You should feel your baby at least 10 times per hour.  If you have not felt 10 kicks within the first hour get up, walk around and have something sweet to eat or drink then repeat for an additional hour.  If count remains less than 10 per hour notify your provider.  Fumigating: Follow your pest control agent's  advice as to how long to stay out of your home.  Ventilate the area well before re-entering.  Hemorrhoids:   Most over-the-counter preparations can be used during pregnancy.  Check your medication to see what is safe to use.  It is important to use a stool softener or fiber in your diet and to drink lots of liquids.  If hemorrhoids seem to be getting worse please call the office.   Hot Tubs:  Hot tubs Jacuzzis and saunas are not recommended while pregnant.  These increase your internal body temperature and should be avoided.  Intercourse:  Sexual intercourse is safe during pregnancy as long as you are comfortable, unless otherwise advised by your provider.  Spotting may occur after intercourse; report any bright red bleeding that is heavier than spotting.  Labor:  If you know that you are in labor, please go to the hospital.  If you are unsure, please call the office and let us help you decide what to do.  Lifting, straining, etc:  If your job requires heavy lifting or straining please check with your provider for any limitations.  Generally, you should not lift items heavier than that you can lift simply with your hands and arms (no back muscles)  Painting:  Paint fumes do not harm your pregnancy, but may make you ill and should be avoided if possible.  Latex or water based paints have less odor than oils.  Use adequate ventilation while painting.  Permanents & Hair Color:  Chemicals in hair dyes are not recommended as they cause increase hair dryness which can increase hair loss during pregnancy.  " Highlighting" and permanents are allowed.  Dye may be absorbed differently and permanents may not hold as well during pregnancy.  Sunbathing:  Use a sunscreen, as skin burns easily during pregnancy.  Drink plenty of fluids; avoid over heating.  Tanning Beds:  Because their possible side effects are still unknown, tanning beds are not recommended.  Ultrasound Scans:  Routine ultrasounds are performed  at approximately 20 weeks.  You will be able to see your baby's general anatomy an if you would like to know the gender this can usually be determined as well.  If it is questionable when you conceived you may also receive an ultrasound early in your pregnancy for dating purposes.  Otherwise ultrasound exams are not routinely performed unless there is a medical necessity.  Although you can request a scan we ask that you pay for it when   conducted because insurance does not cover " patient request" scans.  Work: If your pregnancy proceeds without complications you may work until your due date, unless your physician or employer advises otherwise.  Round Ligament Pain/Pelvic Discomfort:  Sharp, shooting pains not associated with bleeding are fairly common, usually occurring in the second trimester of pregnancy.  They tend to be worse when standing up or when you remain standing for long periods of time.  These are the result of pressure of certain pelvic ligaments called "round ligaments".  Rest, Tylenol and heat seem to be the most effective relief.  As the womb and fetus grow, they rise out of the pelvis and the discomfort improves.  Please notify the office if your pain seems different than that described.  It may represent a more serious condition.  Common Medications Safe in Pregnancy  Acne:      Constipation:  Benzoyl Peroxide     Colace  Clindamycin      Dulcolax Suppository  Topica Erythromycin     Fibercon  Salicylic Acid      Metamucil         Miralax AVOID:        Senakot   Accutane    Cough:  Retin-A       Cough Drops  Tetracycline      Phenergan w/ Codeine if Rx  Minocycline      Robitussin (Plain & DM)  Antibiotics:     Crabs/Lice:  Ceclor       RID  Cephalosporins    AVOID:  E-Mycins      Kwell  Keflex  Macrobid/Macrodantin   Diarrhea:  Penicillin      Kao-Pectate  Zithromax      Imodium AD         PUSH FLUIDS AVOID:       Cipro     Fever:  Tetracycline      Tylenol (Regular  or Extra  Minocycline       Strength)  Levaquin      Extra Strength-Do not          Exceed 8 tabs/24 hrs Caffeine:        <200mg/day (equiv. To 1 cup of coffee or  approx. 3 12 oz sodas)         Gas: Cold/Hayfever:       Gas-X  Benadryl      Mylicon  Claritin       Phazyme  **Claritin-D        Chlor-Trimeton    Headaches:  Dimetapp      ASA-Free Excedrin  Drixoral-Non-Drowsy     Cold Compress  Mucinex (Guaifenasin)     Tylenol (Regular or Extra  Sudafed/Sudafed-12 Hour     Strength)  **Sudafed PE Pseudoephedrine   Tylenol Cold & Sinus     Vicks Vapor Rub  Zyrtec  **AVOID if Problems With Blood Pressure         Heartburn: Avoid lying down for at least 1 hour after meals  Aciphex      Maalox     Rash:  Milk of Magnesia     Benadryl    Mylanta       1% Hydrocortisone Cream  Pepcid  Pepcid Complete   Sleep Aids:  Prevacid      Ambien   Prilosec       Benadryl  Rolaids       Chamomile Tea  Tums (Limit 4/day)     Unisom           Tylenol PM         Warm milk-add vanilla or  Hemorrhoids:       Sugar for taste  Anusol/Anusol H.C.  (RX: Analapram 2.5%)  Sugar Substitutes:  Hydrocortisone OTC     Ok in moderation  Preparation H      Tucks        Vaseline lotion applied to tissue with wiping    Herpes:     Throat:  Acyclovir      Oragel  Famvir  Valtrex     Vaccines:         Flu Shot Leg Cramps:       *Gardasil  Benadryl      Hepatitis A         Hepatitis B Nasal Spray:       Pneumovax  Saline Nasal Spray     Polio Booster         Tetanus Nausea:       Tuberculosis test or PPD  Vitamin B6 25 mg TID   AVOID:    Dramamine      *Gardasil  Emetrol       Live Poliovirus  Ginger Root 250 mg QID    MMR (measles, mumps &  High Complex Carbs @ Bedtime    rebella)  Sea Bands-Accupressure    Varicella (Chickenpox)  Unisom 1/2 tab TID     *No known complications           If received before Pain:         Known pregnancy;   Darvocet       Resume series  after  Lortab        Delivery  Percocet    Yeast:   Tramadol      Femstat  Tylenol 3      Gyne-lotrimin  Ultram       Monistat  Vicodin           MISC:         All Sunscreens           Hair Coloring/highlights          Insect Repellant's          (Including DEET)         Mystic Tans  

## 2023-01-18 NOTE — Progress Notes (Signed)
New OB Intake  I connected with  Martha Tucker on 01/18/23 at 10:15 AM EDT by telephone and verified that I am speaking with the correct person using two identifiers. Nurse is located at [facility] and pt is located at home.  I explained I am completing New OB Intake today. We discussed her EDD of 08/29/2023 that is based on LMP of 11/22/2022. Pt is G3/P2002. I reviewed her allergies, medications, Medical/Surgical/OB history, and appropriate screenings. There are no cats in the home.  Based on history, this is a/an pregnancy uncomplicated .   Patient Active Problem List   Diagnosis Date Noted   Supervision of other normal pregnancy, antepartum 01/18/2023   Overweight BMI=26.2 11/23/2020   Smoker 1/2-3/4 ppd 11/23/2020   Genital herpes simplex type 2 06/13/2017    Concerns addressed today None  Delivery Plans:  Plans to deliver at [facility]; pt stated she would like to deliver at [facility]; adv we only deliver at [facility] so if she goes elsewhere she will have someone deliver her that she hasn't met before.  Pt's voice then sounded like she would stay at [facility] as she said "oh, okay then".  Anatomy US Explained first scheduled [place] will be June 14th and an anatomy scan will be done at 20 weeks.  Labs Discussed genetic screening with patient. Patient delines genetic testing.  Discussed possible labs to be drawn at new OB appointment.  COVID Vaccine Patient has had COVID vaccine.   Social Determinants of Health Food Insecurity: denies food insecurity Transportation: Patient denies transportation needs. Childcare: Discussed no children allowed at ultrasound appointments.   First visit review I reviewed new OB appt with pt. I explained she will have ob bloodwork and pap smear/pelvic exam if indicated. Explained pt will be seen by Carie Caddy, CNM at first visit; encounter routed to appropriate provider.   Loran Senters, [place] 01/18/2023  11:01 AM

## 2023-01-27 ENCOUNTER — Emergency Department: Payer: 59

## 2023-01-27 ENCOUNTER — Emergency Department
Admission: EM | Admit: 2023-01-27 | Discharge: 2023-01-28 | Disposition: A | Discharge status: Home / Self Care | Payer: 59 | Attending: Emergency Medicine | Admitting: Emergency Medicine

## 2023-01-27 ENCOUNTER — Other Ambulatory Visit: Payer: Self-pay

## 2023-01-27 DIAGNOSIS — R102 Pelvic and perineal pain: Secondary | ICD-10-CM | POA: Insufficient documentation

## 2023-01-27 DIAGNOSIS — Z3A09 9 weeks gestation of pregnancy: Secondary | ICD-10-CM | POA: Diagnosis not present

## 2023-01-27 DIAGNOSIS — R1030 Lower abdominal pain, unspecified: Secondary | ICD-10-CM | POA: Diagnosis not present

## 2023-01-27 DIAGNOSIS — Z3A01 Less than 8 weeks gestation of pregnancy: Secondary | ICD-10-CM | POA: Diagnosis not present

## 2023-01-27 DIAGNOSIS — O209 Hemorrhage in early pregnancy, unspecified: Secondary | ICD-10-CM | POA: Diagnosis not present

## 2023-01-27 DIAGNOSIS — R110 Nausea: Secondary | ICD-10-CM | POA: Insufficient documentation

## 2023-01-27 DIAGNOSIS — O26891 Other specified pregnancy related conditions, first trimester: Secondary | ICD-10-CM | POA: Diagnosis not present

## 2023-01-27 LAB — CBC
HCT: 40.7 % (ref 36.0–46.0)
Hemoglobin: 13.4 g/dL (ref 12.0–15.0)
MCH: 26.8 pg (ref 26.0–34.0)
MCHC: 32.9 g/dL (ref 30.0–36.0)
MCV: 81.4 fL (ref 80.0–100.0)
Platelets: 270 10*3/uL (ref 150–400)
RBC: 5 MIL/uL (ref 3.87–5.11)
RDW: 14.7 % (ref 11.5–15.5)
WBC: 9.8 10*3/uL (ref 4.0–10.5)
nRBC: 0 % (ref 0.0–0.2)

## 2023-01-27 LAB — BASIC METABOLIC PANEL
Anion gap: 6 (ref 5–15)
BUN: 16 mg/dL (ref 6–20)
CO2: 24 mmol/L (ref 22–32)
Calcium: 8.9 mg/dL (ref 8.9–10.3)
Chloride: 105 mmol/L (ref 98–111)
Creatinine, Ser: 0.55 mg/dL (ref 0.44–1.00)
GFR, Estimated: >60 mL/min (ref >60)
Glucose, Bld: 92 mg/dL (ref 70–99)
Potassium: 3.6 mmol/L (ref 3.5–5.1)
Sodium: 135 mmol/L (ref 135–145)

## 2023-01-27 LAB — HCG, QUANTITATIVE, PREGNANCY: hCG, Beta Chain, Quant, S: 154670 m[IU]/mL — ABNORMAL HIGH (ref <5)

## 2023-01-27 LAB — ABO/RH

## 2023-01-27 NOTE — ED Triage Notes (Addendum)
Pt to ed from home for pelvic pain. Pt is approx [redacted] weeks pregnant. She has had positive pregnancy test at [facility] department. Pt is CAOx4, in no acute distress and ambulatory in triage. Pt is having some pink tinged discharge as well.

## 2023-01-27 NOTE — ED Notes (Signed)
Lab called re: hcg level, per lab there has been a problem with the instrument and will re-evaluate it and try to run it again which will take about 30 mins

## 2023-01-27 NOTE — ED Provider Notes (Signed)
[facility] Provider Note    Event Date/Time   First MD Initiated Contact with Patient 01/27/23 2256     (approximate)   History   Pelvic Pain   HPI  Martha Tucker is a 32 y.o. female G3P2002 at approximately [redacted] weeks pregnant by dates who presents with bilateral pelvic/lower abdominal pain for the last week, persistent course, and associated today with some pinkish discharge with possible blood.  The patient denies passing any tissue or clots.  She has no urinary symptoms.  She had some nausea last month but denies any active nausea or vomiting at this time.  I reviewed the past medical records.  The patient had a new OB intake visit at [facility] on 5/24.  She has not yet had any imaging for this pregnancy.   Physical Exam   Triage Vital Signs: ED Triage Vitals  Enc Vitals Group     BP 01/27/23 1832 118/87     Pulse Rate 01/27/23 1832 93     Resp 01/27/23 1832 16     Temp 01/27/23 1832 98 F (36.7 C)     Temp Source 01/27/23 1832 Oral     SpO2 01/27/23 1832 98 %     Weight 01/27/23 1831 134 lb 7.7 oz (61 kg)     Height 01/27/23 1831 5' (1.524 m)     Head Circumference --      Peak Flow --      Pain Score 01/27/23 1830 5     Pain Loc --      Pain Edu? --      Excl. in GC? --     Most recent vital signs: Vitals:   01/28/23 0100 01/28/23 0130  BP: 110/73 (!) 122/93  Pulse: 80 80  Resp:    Temp:    SpO2: 99% 98%    General: Awake, no distress.  CV:  Good peripheral perfusion.  Resp:  Normal effort.  Abd:  Soft and nontender.  No distention.  Other:  Moist mucous membranes.   ED Results / Procedures / Treatments   Labs (all labs ordered are listed, but only abnormal results are displayed) Labs Reviewed  HCG, QUANTITATIVE, PREGNANCY - Abnormal; Notable for the following components:      Result Value   hCG, Beta Chain, Quant, S 154,670 (*)    All other components within normal limits  URINALYSIS, ROUTINE W REFLEX  MICROSCOPIC - Abnormal; Notable for the following components:   Color, Urine YELLOW (*)    APPearance HAZY (*)    Glucose, UA 150 (*)    All other components within normal limits  CBC  BASIC METABOLIC PANEL  ABO/RH  ABO/RH     EKG     RADIOLOGY  US OB pelvis: I independently viewed and interpreted the images; there is an IUP with FHR.  Radiology report indicates no acute abnormality.   PROCEDURES:  Critical Care performed: No  Procedures   MEDICATIONS ORDERED IN ED: Medications - No data to display   IMPRESSION / MDM / ASSESSMENT AND PLAN / ED COURSE  I reviewed the triage vital signs and the nursing notes.  32 year old female G3P2002 at approximately 9 weeks by dates presents with pelvic/lower abdominal pain and today with some possible low blood.  On exam she is comfortable appearing.  Vital signs are normal.  The abdomen is soft and nontender.  Patient was offered but declined pelvic exam.  Differential diagnosis includes, but is not limited  to, round ligament pain, subchorionic hemorrhage, UTI, other benign etiology versus less likely ectopic pregnancy.  We will obtain labs, urinalysis, ultrasound, and reassess.  Patient's presentation is most consistent with acute complicated illness / injury requiring diagnostic workup.   ----------------------------------------- 1:34 AM on 01/28/2023 -----------------------------------------  Ultrasound shows an IUP with FHR.  Urinalysis is negative.  CBC shows no leukocytosis.  At this time, the patient is stable for discharge home.  Presentation is most consistent with round ligament pain or other benign etiology.  She has follow-up with OB/GYN arranged in a few weeks.  I gave her strict return precautions and she expressed understanding.  FINAL CLINICAL IMPRESSION(S) / ED DIAGNOSES   Final diagnoses:  Abdominal pain during pregnancy in first trimester     Rx / DC Orders   ED Discharge Orders     None         Note:  This document was prepared using Dragon voice recognition software and may include unintentional dictation errors.    Dionne Bucy, MD 01/28/23 [phone]

## 2023-01-27 NOTE — ED Provider Triage Note (Signed)
Emergency Medicine Provider Triage Evaluation Note  Martha Tucker , a 32 y.o. female  was evaluated in triage.  Pt complains of pelvic pain with pink tinged discharge for the past week. She is about [redacted] weeks pregnant. .  Physical Exam  BP 118/87 (BP Location: Right Arm)   Pulse 93   Temp 98 F (36.7 C) (Oral)   Resp 16   Ht 5' (1.524 m)   Wt 61 kg   LMP 11/22/2022 (Exact Date)   SpO2 98%   BMI 26.26 kg/m  Gen:   Awake, no distress   Resp:  Normal effort  MSK:   Moves extremities without difficulty  Other:    Medical Decision Making  Medically screening exam initiated at 6:34 PM.  Appropriate orders placed.  Camila Li was informed that the remainder of the evaluation will be completed by another provider, this initial triage assessment does not replace that evaluation, and the importance of remaining in the ED until their evaluation is complete.    Chinita Pester, FNP 01/27/23 1835

## 2023-01-28 DIAGNOSIS — O26891 Other specified pregnancy related conditions, first trimester: Secondary | ICD-10-CM | POA: Diagnosis not present

## 2023-01-28 LAB — URINALYSIS, ROUTINE W REFLEX MICROSCOPIC
Bilirubin Urine: NEGATIVE
Glucose, UA: 150 mg/dL — AB
Hgb urine dipstick: NEGATIVE
Ketones, ur: NEGATIVE mg/dL
Leukocytes,Ua: NEGATIVE
Nitrite: NEGATIVE
Protein, ur: NEGATIVE mg/dL
Specific Gravity, Urine: 1.028 (ref 1.005–1.030)
pH: 5 (ref 5.0–8.0)

## 2023-01-28 NOTE — Discharge Instructions (Signed)
Return to the ER for new, worsening, or persistent severe pain, bleeding, urinary symptoms, fever, or any other new or worsening symptoms that concern you.  Follow-up with your OB/GYN as scheduled.

## 2023-02-08 ENCOUNTER — Other Ambulatory Visit (HOSPITAL_COMMUNITY)
Admission: RE | Admit: 2023-02-08 | Discharge: 2023-02-08 | Disposition: A | Discharge status: Home / Self Care | Payer: 59 | Source: Ambulatory Visit | Attending: Licensed Practical Nurse | Admitting: Licensed Practical Nurse

## 2023-02-08 ENCOUNTER — Other Ambulatory Visit: Payer: Self-pay | Admitting: Licensed Practical Nurse

## 2023-02-08 ENCOUNTER — Ambulatory Visit (INDEPENDENT_AMBULATORY_CARE_PROVIDER_SITE_OTHER): Payer: 59

## 2023-02-08 ENCOUNTER — Other Ambulatory Visit: Payer: 59

## 2023-02-08 DIAGNOSIS — Z3687 Encounter for antenatal screening for uncertain dates: Secondary | ICD-10-CM | POA: Diagnosis not present

## 2023-02-08 DIAGNOSIS — Z3A11 11 weeks gestation of pregnancy: Secondary | ICD-10-CM

## 2023-02-08 DIAGNOSIS — Z369 Encounter for antenatal screening, unspecified: Secondary | ICD-10-CM | POA: Insufficient documentation

## 2023-02-08 DIAGNOSIS — Z1379 Encounter for other screening for genetic and chromosomal anomalies: Secondary | ICD-10-CM | POA: Diagnosis not present

## 2023-02-08 DIAGNOSIS — Z3480 Encounter for supervision of other normal pregnancy, unspecified trimester: Secondary | ICD-10-CM | POA: Insufficient documentation

## 2023-02-08 DIAGNOSIS — Z130 Encounter for screening for diseases of the blood and blood-forming organs and certain disorders involving the immune mechanism: Secondary | ICD-10-CM

## 2023-02-08 LAB — OB RESULTS CONSOLE VARICELLA ZOSTER ANTIBODY, IGG: Varicella: IMMUNE

## 2023-02-09 LAB — URINALYSIS, ROUTINE W REFLEX MICROSCOPIC
Bilirubin, UA: NEGATIVE
Leukocytes,UA: NEGATIVE
Nitrite, UA: NEGATIVE
RBC, UA: NEGATIVE
Specific Gravity, UA: >=1.03 — AB (ref 1.005–1.030)
Urobilinogen, Ur: 0.2 mg/dL (ref 0.2–1.0)
pH, UA: 5.5 (ref 5.0–7.5)

## 2023-02-10 LAB — URINE CULTURE, OB REFLEX

## 2023-02-10 LAB — CULTURE, OB URINE

## 2023-02-11 LAB — URINE CYTOLOGY ANCILLARY ONLY
Chlamydia: NEGATIVE
Comment: NEGATIVE
Comment: NORMAL
Neisseria Gonorrhea: NEGATIVE

## 2023-02-13 LAB — CBC/D/PLT+RPR+RH+ABO+RUBIGG...
Antibody Screen: NEGATIVE
Basophils Absolute: 0 10*3/uL (ref 0.0–0.2)
Basos: 0 %
EOS (ABSOLUTE): 0 10*3/uL (ref 0.0–0.4)
Eos: 0 %
HCV Ab: NONREACTIVE
HIV Screen 4th Generation wRfx: NONREACTIVE
Hematocrit: 40.1 % (ref 34.0–46.6)
Hemoglobin: 13.3 g/dL (ref 11.1–15.9)
Hepatitis B Surface Ag: NEGATIVE
Immature Grans (Abs): 0 10*3/uL (ref 0.0–0.1)
Immature Granulocytes: 0 %
Lymphocytes Absolute: 2.4 10*3/uL (ref 0.7–3.1)
Lymphs: 25 %
MCH: 26.8 pg (ref 26.6–33.0)
MCHC: 33.2 g/dL (ref 31.5–35.7)
MCV: 81 fL (ref 79–97)
Monocytes Absolute: 1 10*3/uL — ABNORMAL HIGH (ref 0.1–0.9)
Monocytes: 10 %
Neutrophils Absolute: 6.1 10*3/uL (ref 1.4–7.0)
Neutrophils: 65 %
Platelets: 277 10*3/uL (ref 150–450)
RBC: 4.97 x10E6/uL (ref 3.77–5.28)
RDW: 14.5 % (ref 11.7–15.4)
RPR Ser Ql: NONREACTIVE
Rh Factor: POSITIVE
Rubella Antibodies, IGG: 1.5 index (ref >0.99)
Varicella zoster IgG: 2914 index (ref >165)
WBC: 9.5 10*3/uL (ref 3.4–10.8)

## 2023-02-13 LAB — HGB FRACTIONATION CASCADE
Hgb A2: 2.9 % (ref 1.8–3.2)
Hgb A: 97.1 % (ref 96.4–98.8)
Hgb F: 0 % (ref 0.0–2.0)
Hgb S: 0 %

## 2023-02-13 LAB — HCV INTERPRETATION

## 2023-02-14 ENCOUNTER — Ambulatory Visit (INDEPENDENT_AMBULATORY_CARE_PROVIDER_SITE_OTHER): Payer: 59 | Admitting: Licensed Practical Nurse

## 2023-02-14 ENCOUNTER — Encounter: Payer: Self-pay | Admitting: Licensed Practical Nurse

## 2023-02-14 VITALS — BP 118/86 | HR 94 | Wt 143.6 lb

## 2023-02-14 DIAGNOSIS — Z3481 Encounter for supervision of other normal pregnancy, first trimester: Secondary | ICD-10-CM

## 2023-02-14 DIAGNOSIS — Z3A12 12 weeks gestation of pregnancy: Secondary | ICD-10-CM

## 2023-02-14 DIAGNOSIS — Z3480 Encounter for supervision of other normal pregnancy, unspecified trimester: Secondary | ICD-10-CM

## 2023-02-14 LAB — POCT URINALYSIS DIPSTICK
Bilirubin, UA: NEGATIVE
Blood, UA: NEGATIVE
Glucose, UA: NEGATIVE
Ketones, UA: NEGATIVE
Leukocytes, UA: NEGATIVE
Nitrite, UA: NEGATIVE
Protein, UA: NEGATIVE
Spec Grav, UA: 1.02 (ref 1.010–1.025)
Urobilinogen, UA: 0.2 E.U./dL
pH, UA: 7 (ref 5.0–8.0)

## 2023-02-14 NOTE — Patient Instructions (Addendum)
To reduce the number of daily headaches Magnesium oxide 400mg  daily Co Enzyme Q 10 150mg  daily Riboflavin 400mg  daily

## 2023-02-14 NOTE — Progress Notes (Signed)
New Obstetric Patient H&P    Chief Complaint: "Desires prenatal care"   History of Present Illness: Patient is a 32 y.o. [id number] Not Hispanic or Latino female, presents with amenorrhea and positive home pregnancy test. Patient's last menstrual period was 11/22/2022 (exact date). and based on her  LMP, her EDD is Estimated Date of Delivery: 08/29/23 and her EGA is [redacted]w[redacted]d. Cycles are regular, and occur approximately every : 28 days. Her last pap smear was 1 years ago and was no abnormalities.    Her nexplanon was removed d/t heavy bleeding, she was told to gve her body "time to reset" never restarted any birth control. She is happy to be pregnant.    Since her LMP she claims she has experienced headaches, nausea, heartburn. She denies vaginal bleeding. Her past medical history is noncontributory. Her prior pregnancies are notable for none (SVB at term x2, 7lbs15oz ands 9lbs 8oz no complications, gained 20-30 lbs, was not able to breastfeed her first child, had trouble with formula with her second, would like to try to breastfeed)  Since her LMP, she admits to the use of tobacco products  yes 2 cigarettes a day  She claims she has gained    7  pounds since the start of her pregnancy.  There are cats in the home in the home  no  She admits close contact with children on a regular basis  yes  She has had chicken pox in the past yes She has had Tuberculosis exposures, symptoms, or previously tested positive for TB   no Current or past history of domestic violence. no  Genetic Screening/Teratology Counseling: (Includes patient, baby's father, or anyone in either family with:)   1. Patient's age >/= 41 at [facility]  no 2. Thalassemia ([place], [place], Mediterranean, or Asian background): MCV<80  no 3. Neural tube defect (meningomyelocele, spina bifida, anencephaly)  no 4. Congenital heart defect  no  5. Down syndrome  no 6. Tay-Sachs (Jewish, [place]  no 7. Canavan's Disease  no 8. Sickle  cell disease or trait (African)  no  9. Hemophilia or other blood disorders  no  10. Muscular dystrophy  no  11. Cystic fibrosis  no  12. Huntington's Chorea  no  13. Mental retardation/autism  no 14. Other inherited genetic or chromosomal disorder  no 15. Maternal metabolic disorder (DM, PKU, etc)  no 16. Patient or FOB with a child with a birth defect not listed above no  16a. Patient or FOB with a birth defect themselves no 17. Recurrent pregnancy loss, or stillbirth  no  18. Any medications since LMP other than prenatal vitamins (include vitamins, supplements, OTC meds, drugs, alcohol)  no 19. Any other genetic/environmental exposure to discuss  no  Infection History:   1. Lives with someone with TB or TB exposed  no  2. Patient or partner has history of genital herpes  yes 3. Rash or viral illness since LMP  no 4. History of STI (GC, CT, HPV, syphilis, HIV)  no 5. History of recent travel :  no  Other pertinent information:  yes  Works as a Lawyer in 2 different retirement homes Lives with her children Has a partner, Weston Brass, he is the father of her younger child her also has an older child Last saw dentist 1 year ago Exercise: is busy at work  Indications for ASA therapy (per uptodate) One of the following: Previous pregnancy with preeclampsia, especially early onset and with an adverse outcome No Multifetal  gestation No Chronic hypertension No Type 1 or 2 diabetes mellitus No Chronic kidney disease No Autoimmune disease (antiphospholipid syndrome, systemic lupus erythematosus) No  Two or more of the following: Nulliparity No Obesity (body mass index >30 kg/m2) No Family history of preeclampsia in mother or sister No Age ?35 years No Sociodemographic characteristics (African American race, low socioeconomic level) Yes Personal risk factors (eg, previous pregnancy with low birth weight or small for gestational age infant, previous adverse pregnancy outcome [eg, stillbirth],  interval >10 years between pregnancies) No    Review of Systems:10 point review of systems negative unless otherwise noted in HPI  Past Medical History:  Patient Active Problem List   Diagnosis Date Noted   Supervision of other normal pregnancy, antepartum 01/18/2023     Clinical Staff Provider  Office Location  [facility] Dating  L and 9wk [place]  Language  English Anatomy US    Flu Vaccine  offer Genetic Screen  NIPS:   TDaP vaccine   offer Hgb A1C or  GTT Early : Third trimester :   Covid One dose   LAB RESULTS   Rhogam  O/Positive/-- (06/14 0931)  Blood Type O/Positive/-- (06/14 0931)   Feeding Plan formula Antibody Negative (06/14 0931)  Contraception nexplanon Rubella 1.50 (06/14 0931)  Circumcision yes RPR Non Reactive (06/14 0931)   Pediatrician  [facility] HBsAg Negative (06/14 0931)   Support Person Weston Brass HIV Non Reactive (06/14 0931)  Prenatal Classes Yes if virtual Varicella Immune    GBS  (For PCN allergy, check sensitivities)   BTL Consent  Hep C Non Reactive (06/14 0931)   VBAC Consent  Pap No results found for: "DIAGPAP"    Hgb Electro      CF      SMA            Overweight BMI=26.2 11/23/2020   Smoker 1/2-3/4 ppd 11/23/2020   Genital herpes simplex type 2 06/13/2017    Desires episodic tx with Valacyclovir     Past Surgical History:  Past Surgical History:  Procedure Laterality Date   TONSILLECTOMY      Gynecologic History: Patient's last menstrual period was 11/22/2022 (exact date).  Obstetric History: [id number]  Family History:  Family History  Problem Relation Age of Onset   Hypertension Mother    Arthritis Mother    Kidney disease Father    Arthritis Father    Healthy Sister    Healthy Brother    Healthy Brother    Healthy Brother    Healthy Brother    Healthy Brother    Healthy Maternal Grandmother    Healthy Maternal Grandfather    CVA Paternal Grandmother    Cancer Paternal Grandfather 13       liver   Breast cancer Neg Hx      Social History:  Social History   Socioeconomic History   Marital status: Significant Other    Spouse name: Not on file   Number of children: 2   Years of education: 14   Highest education level: Associate degree: academic program  Occupational History   Occupation: CNA  Tobacco Use   Smoking status: Every Day    Packs/day: 0.25    Years: 15.00    Additional pack years: 0.00    Total pack years: 3.75    Types: Cigarettes   Smokeless tobacco: Never   Tobacco comments:    Smoking and Pregnancy literature given 01/01/23; 01/18/23 - pt states she is down to one  cig a day  Vaping Use   Vaping Use: Never used  Substance and Sexual Activity   Alcohol use: Not Currently    Comment: Last ETOH use weekend of 11/19/2020 (1 bottle wine)   Drug use: Not Currently    Types: Marijuana    Comment: Last use -  marijuana use 2019   Sexual activity: Yes    Partners: Male    Birth control/protection: None    Comment: hx implant removed 2 years ago  Other Topics Concern   Not on file  Social History Narrative   ** Merged History Encounter **       Social Determinants of Health   Financial Resource Strain: Low Risk  (01/18/2023)   Overall Financial Resource Strain (CARDIA)    Difficulty of Paying Living Expenses: Not very hard  Food Insecurity: No Food Insecurity (01/18/2023)   Hunger Vital Sign    Worried About Running Out of Food in the Last Year: Never true    Ran Out of Food in the Last Year: Never true  Transportation Needs: No Transportation Needs (01/18/2023)   PRAPARE - Administrator, Civil Service (Medical): No    Lack of Transportation (Non-Medical): No  Physical Activity: Insufficiently Active (01/18/2023)   Exercise Vital Sign    Days of Exercise per Week: 2 days    Minutes of Exercise per Session: 30 min  Stress: No Stress Concern Present (01/18/2023)   [employer] of Occupational Health - Occupational Stress Questionnaire    Feeling of Stress : Not at  all  Social Connections: Unknown (01/18/2023)   Social Connection and Isolation Panel [NHANES]    Frequency of Communication with Friends and Family: More than three times a week    Frequency of Social Gatherings with Friends and Family: Twice a week    Attends Religious Services: Never    Database administrator or Organizations: No    Attends Banker Meetings: Never    Marital Status: Not on file  Intimate Partner Violence: Not At Risk (01/18/2023)   Humiliation, Afraid, Rape, and Kick questionnaire    Fear of Current or Ex-Partner: No    Emotionally Abused: No    Physically Abused: No    Sexually Abused: No    Allergies:  No Known Allergies  Medications: Prior to Admission medications   Medication Sig Start Date End Date Taking? Authorizing Provider  Prenatal Vit-Fe Fumarate-FA (PRENATAL VITAMINS) 28-0.8 MG TABS Take 28 mg by mouth daily. 01/01/23 04/11/23 Yes Federico Flake, MD  valACYclovir (VALTREX) 500 MG tablet Take 1 tablet (500 mg total) by mouth 2 (two) times daily. 01/18/22  Yes Wendi Snipes, FNP    Physical Exam Vitals: Blood pressure 118/86, pulse 94, weight 143 lb 9.6 oz (65.1 kg), last menstrual period 11/22/2022.  General: NAD HEENT: normocephalic, anicteric Thyroid: no enlargement, no palpable nodules Pulmonary: No increased work of breathing, CTAB Cardiovascular: RRR, distal pulses 2+ Abdomen: NABS, soft, non-tender, non-distended.  Umbilicus without lesions.  No hepatomegaly, splenomegaly or masses palpable. No evidence of hernia  Genitourinary: fetal heart tone 160's   External: Normal external female genitalia.  Normal urethral meatus, normal  Bartholin's and Skene's glands.    Vagina: exam deferred     Cervix:   Uterus:   Adnexa:   Rectal: deferred Extremities: no edema, erythema, or tenderness Neurologic: Grossly intact Psychiatric: mood appropriate, affect full   Assessment: 32 y.o. G3P2002 at [redacted]w[redacted]d presenting to initiate  prenatal care  Plan:  1) Avoid alcoholic beverages. 2) Patient encouraged not to smoke.  3) Discontinue the use of all non-medicinal drugs and chemicals.  4) Take prenatal vitamins daily.  5) Nutrition, food safety (fish, cheese advisories, and high nitrite foods) and exercise discussed. 6) Hospital and practice style discussed with cross coverage system.  7) Genetic Screening, such as with 1st Trimester Screening, cell free fetal DNA, AFP testing, and Ultrasound, as well as with amniocentesis and CVS as appropriate, is discussed with patient. At the conclusion of today's visit patient collected 6/14 genetic testing 8) Patient is asked about travel to areas at risk for the [place] virus, and counseled to avoid travel and exposure to mosquitoes or sexual partners who may have themselves been exposed to the virus. Testing is discussed, and will be ordered as appropriate.     Carie Caddy, CNM  [facility] 02/14/2023, 12:28 PM

## 2023-02-15 LAB — MATERNIT 21 PLUS CORE, BLOOD
Fetal Fraction: 6
Result (T21): NEGATIVE
Trisomy 13 (Patau syndrome): NEGATIVE
Trisomy 18 (Edwards syndrome): NEGATIVE
Trisomy 21 (Down syndrome): NEGATIVE

## 2023-02-18 LAB — MONITOR DRUG PROFILE 14(MW)
Amphetamine Scrn, Ur: NEGATIVE ng/mL
BARBITURATE SCREEN URINE: NEGATIVE ng/mL
BENZODIAZEPINE SCREEN, URINE: NEGATIVE ng/mL
Buprenorphine, Urine: NEGATIVE ng/mL
Cocaine (Metab) Scrn, Ur: NEGATIVE ng/mL
Creatinine(Crt), U: 197.6 mg/dL (ref 20.0–300.0)
Fentanyl, Urine: NEGATIVE pg/mL
Meperidine Screen, Urine: NEGATIVE ng/mL
Methadone Screen, Urine: NEGATIVE ng/mL
OXYCODONE+OXYMORPHONE UR QL SCN: NEGATIVE ng/mL
Opiate Scrn, Ur: NEGATIVE ng/mL
Ph of Urine: 5.4 (ref 4.5–8.9)
Phencyclidine Qn, Ur: NEGATIVE ng/mL
Propoxyphene Scrn, Ur: NEGATIVE ng/mL
SPECIFIC GRAVITY: 1.023
Tramadol Screen, Urine: NEGATIVE ng/mL

## 2023-02-18 LAB — NICOTINE SCREEN, URINE: Cotinine Ql Scrn, Ur: POSITIVE ng/mL — AB

## 2023-02-18 LAB — CANNABINOID (GC/MS), URINE
Cannabinoid: POSITIVE — AB
Carboxy THC (GC/MS): 58 ng/mL

## 2023-03-14 ENCOUNTER — Encounter: Payer: Self-pay | Admitting: Obstetrics and Gynecology

## 2023-03-14 ENCOUNTER — Ambulatory Visit (INDEPENDENT_AMBULATORY_CARE_PROVIDER_SITE_OTHER): Payer: 59 | Admitting: Obstetrics and Gynecology

## 2023-03-14 VITALS — BP 111/81 | HR 83 | Wt 148.0 lb

## 2023-03-14 DIAGNOSIS — Z720 Tobacco use: Secondary | ICD-10-CM

## 2023-03-14 DIAGNOSIS — Z3480 Encounter for supervision of other normal pregnancy, unspecified trimester: Secondary | ICD-10-CM

## 2023-03-14 DIAGNOSIS — Z1379 Encounter for other screening for genetic and chromosomal anomalies: Secondary | ICD-10-CM | POA: Diagnosis not present

## 2023-03-14 DIAGNOSIS — Z3A16 16 weeks gestation of pregnancy: Secondary | ICD-10-CM

## 2023-03-14 LAB — POCT URINALYSIS DIPSTICK OB
Bilirubin, UA: NEGATIVE
Glucose, UA: NEGATIVE
Ketones, UA: NEGATIVE
Leukocytes, UA: NEGATIVE
Nitrite, UA: NEGATIVE
Spec Grav, UA: 1.015 (ref 1.010–1.025)
Urobilinogen, UA: 0.2 E.U./dL
pH, UA: 6.5 (ref 5.0–8.0)

## 2023-03-14 NOTE — Progress Notes (Signed)
ROB [redacted]w[redacted]d: She is doing well. Has felt some flutters. She has no new concerns today.

## 2023-03-14 NOTE — Progress Notes (Signed)
ROB: Patient is a 32 y.o. G3P2002 at [redacted]w[redacted]d who presents for routine OB care.  Pregnancy is complicated by  Genital herpes simplex type 2; Overweight BMI=26.2; Smoker 1/2-3/4 ppd; and Supervision of other normal pregnancy, antepartum. Patient denies complaints.  Normal MaterniT21, discussed AFP, patient ok to perform. Encourage smoking cessation. RTC in 4 weeks, for anatomy scan then.

## 2023-03-18 LAB — AFP, SERUM, OPEN SPINA BIFIDA
AFP MoM: 1.02
AFP Value: 38.1 ng/mL
Gest. Age on Collection Date: 16 weeks
Maternal Age At EDD: 33 yr
OSBR Risk 1 IN: 10000
Test Results:: NEGATIVE
Weight: 148 [lb_av]

## 2023-04-12 ENCOUNTER — Ambulatory Visit (INDEPENDENT_AMBULATORY_CARE_PROVIDER_SITE_OTHER): Payer: 59

## 2023-04-12 ENCOUNTER — Encounter: Payer: Self-pay | Admitting: Obstetrics and Gynecology

## 2023-04-12 ENCOUNTER — Ambulatory Visit (INDEPENDENT_AMBULATORY_CARE_PROVIDER_SITE_OTHER): Payer: 59 | Admitting: Obstetrics and Gynecology

## 2023-04-12 VITALS — BP 113/79 | HR 79 | Wt 153.3 lb

## 2023-04-12 DIAGNOSIS — Z3A20 20 weeks gestation of pregnancy: Secondary | ICD-10-CM

## 2023-04-12 DIAGNOSIS — Z3482 Encounter for supervision of other normal pregnancy, second trimester: Secondary | ICD-10-CM

## 2023-04-12 DIAGNOSIS — Z3480 Encounter for supervision of other normal pregnancy, unspecified trimester: Secondary | ICD-10-CM

## 2023-04-12 DIAGNOSIS — Z362 Encounter for other antenatal screening follow-up: Secondary | ICD-10-CM

## 2023-04-12 DIAGNOSIS — Z369 Encounter for antenatal screening, unspecified: Secondary | ICD-10-CM | POA: Diagnosis not present

## 2023-04-12 LAB — POCT URINALYSIS DIPSTICK OB
Bilirubin, UA: NEGATIVE
Blood, UA: NEGATIVE
Glucose, UA: NEGATIVE
Ketones, UA: NEGATIVE
Leukocytes, UA: NEGATIVE
Nitrite, UA: NEGATIVE
Spec Grav, UA: 1.02 (ref 1.010–1.025)
Urobilinogen, UA: 0.2 E.U./dL
pH, UA: 5 (ref 5.0–8.0)

## 2023-04-12 NOTE — Progress Notes (Signed)
ROB: She is doing well.  She says she is having no difficulty and feels great.  Ultrasound for anatomy today normal except spine not seen.  Will follow-up in a few weeks.  She reports fetal movement at this point.  She has decreased her smoking to 2 cigarettes or less per day!!

## 2023-04-12 NOTE — Progress Notes (Signed)
ROB. She states daily fetal movement. Anatomy ultrasound today, needs follow-up in 2-4 weeks, ultrasound ordered. She states no questions or concerns at this time.

## 2023-05-10 ENCOUNTER — Telehealth: Payer: Self-pay | Admitting: Obstetrics

## 2023-05-10 ENCOUNTER — Encounter: Payer: 59 | Admitting: Obstetrics

## 2023-05-10 NOTE — Telephone Encounter (Signed)
Reached out to pt to reschedule the ROB appt that was scheduled on 05/10/2023 at 10:55 with MMF.  Was able to reschedule the appt for 05/13/2023 with Melissa at 9:35.

## 2023-05-13 ENCOUNTER — Ambulatory Visit (INDEPENDENT_AMBULATORY_CARE_PROVIDER_SITE_OTHER): Payer: 59 | Admitting: Obstetrics

## 2023-05-13 VITALS — BP 115/77 | HR 85 | Wt 158.0 lb

## 2023-05-13 DIAGNOSIS — Z3480 Encounter for supervision of other normal pregnancy, unspecified trimester: Secondary | ICD-10-CM

## 2023-05-13 DIAGNOSIS — Z113 Encounter for screening for infections with a predominantly sexual mode of transmission: Secondary | ICD-10-CM

## 2023-05-13 DIAGNOSIS — Z131 Encounter for screening for diabetes mellitus: Secondary | ICD-10-CM

## 2023-05-13 DIAGNOSIS — D649 Anemia, unspecified: Secondary | ICD-10-CM

## 2023-05-13 DIAGNOSIS — Z3A24 24 weeks gestation of pregnancy: Secondary | ICD-10-CM

## 2023-05-13 NOTE — Assessment & Plan Note (Addendum)
-  Discussed 28-week labs at next visit -Working towards smoking cessation - down to 1-2 cigarettes/day -Encouraged BF class

## 2023-05-13 NOTE — Progress Notes (Signed)
    Return Prenatal Note   Assessment/Plan   Plan  32 y.o. G3P2002 at [redacted]w[redacted]d presents for follow-up OB visit. Reviewed prenatal record including previous visit note.  Supervision of other normal pregnancy, antepartum -Discussed 28-week labs at next visit -Working towards smoking cessation - down to 1-2 cigarettes/day -Encouraged BF class   Orders Placed This Encounter  Procedures   28 Week RH+Panel    Standing Status:   Future    Standing Expiration Date:   05/12/2024   Return in about 4 weeks (around 06/10/2023).   Future Appointments  Date Time Provider Department Center  05/15/2023 10:15 AM AOB-AOB [place] 1 AOB-IMG None  06/10/2023  9:20 AM AOB-OBGYN LAB AOB-AOB None  06/10/2023 10:15 AM Dominic, Courtney Heys, CNM AOB-AOB None    For next visit:  ROB with 1 hour gluocla, third trimester labs, and Tdap     Subjective   Martha Tucker is doing well. She is working nights as a Lawyer. Having some BH ctx. She is planning to breastfeed this baby (has not BF her other children) and is working on quitting smoking altogether.  Movement: Present Contractions: Not present  Objective   Flow sheet Vitals: Pulse Rate: 85 BP: 115/77 Fundal Height: 24 cm Fetal Heart Rate (bpm): 147 Total weight gain: 22 lb (9.979 kg)  General Appearance  No acute distress, well appearing, and well nourished Pulmonary   Normal work of breathing Neurologic   Alert and oriented to person, place, and time Psychiatric   Mood and affect within normal limits  Guadlupe Spanish, CNM 05/13/23 9:58 AM

## 2023-05-15 ENCOUNTER — Ambulatory Visit (INDEPENDENT_AMBULATORY_CARE_PROVIDER_SITE_OTHER): Payer: 59

## 2023-05-15 DIAGNOSIS — Z3480 Encounter for supervision of other normal pregnancy, unspecified trimester: Secondary | ICD-10-CM

## 2023-05-15 DIAGNOSIS — Z362 Encounter for other antenatal screening follow-up: Secondary | ICD-10-CM

## 2023-05-15 DIAGNOSIS — Z3A24 24 weeks gestation of pregnancy: Secondary | ICD-10-CM

## 2023-06-10 ENCOUNTER — Other Ambulatory Visit: Payer: 59

## 2023-06-10 ENCOUNTER — Encounter: Payer: Self-pay | Admitting: Licensed Practical Nurse

## 2023-06-10 ENCOUNTER — Ambulatory Visit (INDEPENDENT_AMBULATORY_CARE_PROVIDER_SITE_OTHER): Payer: 59 | Admitting: Licensed Practical Nurse

## 2023-06-10 VITALS — BP 123/79 | HR 104 | Wt 164.3 lb

## 2023-06-10 DIAGNOSIS — D649 Anemia, unspecified: Secondary | ICD-10-CM

## 2023-06-10 DIAGNOSIS — Z131 Encounter for screening for diabetes mellitus: Secondary | ICD-10-CM

## 2023-06-10 DIAGNOSIS — Z3480 Encounter for supervision of other normal pregnancy, unspecified trimester: Secondary | ICD-10-CM

## 2023-06-10 DIAGNOSIS — S334XXA Traumatic rupture of symphysis pubis, initial encounter: Secondary | ICD-10-CM

## 2023-06-10 DIAGNOSIS — Z23 Encounter for immunization: Secondary | ICD-10-CM | POA: Diagnosis not present

## 2023-06-10 DIAGNOSIS — Z3483 Encounter for supervision of other normal pregnancy, third trimester: Secondary | ICD-10-CM

## 2023-06-10 DIAGNOSIS — Z3A28 28 weeks gestation of pregnancy: Secondary | ICD-10-CM

## 2023-06-10 DIAGNOSIS — Z113 Encounter for screening for infections with a predominantly sexual mode of transmission: Secondary | ICD-10-CM

## 2023-06-10 DIAGNOSIS — Z3A24 24 weeks gestation of pregnancy: Secondary | ICD-10-CM

## 2023-06-10 LAB — POCT URINALYSIS DIPSTICK
Bilirubin, UA: NEGATIVE
Blood, UA: NEGATIVE
Glucose, UA: NEGATIVE
Ketones, UA: NEGATIVE
Leukocytes, UA: NEGATIVE
Nitrite, UA: NEGATIVE
Protein, UA: NEGATIVE
Spec Grav, UA: 1.025 (ref 1.010–1.025)
Urobilinogen, UA: 0.2 U/dL
pH, UA: 5 (ref 5.0–8.0)

## 2023-06-10 NOTE — Progress Notes (Signed)
Routine Prenatal Care Visit  Subjective  Martha Tucker is a 32 y.o. G3P2002 at [redacted]w[redacted]d being seen today for ongoing prenatal care.  She is currently monitored for the following issues for this low-risk pregnancy and has Genital herpes simplex type 2; Overweight BMI=26.2; Smoker 1/2-3/4 ppd; and Supervision of other normal pregnancy, antepartum on their problem list.  ----------------------------------------------------------------------------------- Patient reports  having significant SPD since 4 this morning, she has been having this pain but ut has been milk until today .  Self care measures reviewed, info placed in AVS and referral to PT made. -Mood has been good -Planning Nexplanon for contraception   Contractions: Not present. Vag. Bleeding: None.  Movement: Present. Leaking Fluid denies.  ----------------------------------------------------------------------------------- The following portions of the patient's history were reviewed and updated as appropriate: allergies, current medications, past family history, past medical history, past social history, past surgical history and problem list. Problem list updated.  Objective  Blood pressure 123/79, pulse (!) 104, weight 164 lb 4.8 oz (74.5 kg), last menstrual period 11/22/2022. Pregravid weight 136 lb (61.7 kg) Total Weight Gain 28 lb 4.8 oz (12.8 kg) Urinalysis: Urine Protein    Urine Glucose    Fetal Status: Fetal Heart Rate (bpm): 145 Fundal Height: 30 cm Movement: Present     General:  Alert, oriented and cooperative. Patient is in no acute distress.  Skin: Skin is warm and dry. No rash noted.   Cardiovascular: Normal heart rate noted  Respiratory: Normal respiratory effort, no problems with respiration noted  Abdomen: Soft, gravid, appropriate for gestational age. Pain/Pressure: Absent     Pelvic:  Cervical exam deferred        Extremities: Normal range of motion.  Edema: Trace  Mental Status: Normal mood and affect. Normal  behavior. Normal judgment and thought content.   Assessment   32 y.o. [id number] at [redacted]w[redacted]d by  08/29/2023, Date entered prior to episode creation presenting for routine prenatal visit  Plan   third Problems (from 01/18/23 to present)     Problem Noted Resolved   Supervision of other normal pregnancy, antepartum 01/18/2023 by Loran Senters, CMA No   Overview Addendum 06/10/2023 10:46 AM by Burtis Junes, CMA     Clinical Staff Provider  Office Location  [facility] Dating  L and 9wk [place]  Language  English Anatomy US    Flu Vaccine  06/10/2023 Genetic Screen  NIPS: Negative/Female (02/08/23)  TDaP vaccine   06/10/2023 Hgb A1C or  GTT Early : Third trimester :   Covid One dose   LAB RESULTS   Rhogam  O/Positive/-- (06/14 0931)  Blood Type O/Positive/-- (06/14 0931)   Feeding Plan formula Antibody Negative (06/14 0931)  Contraception nexplanon Rubella 1.50 (06/14 0931)  Circumcision yes RPR Non Reactive (06/14 0931)   Pediatrician  [facility] HBsAg Negative (06/14 0931)   Support Person Weston Brass HIV Non Reactive (06/14 0931)  Prenatal Classes Yes if virtual Varicella Immune    GBS  (For PCN allergy, check sensitivities)   BTL Consent  Hep C Non Reactive (06/14 0931)   VBAC Consent  Pap No results found for: "DIAGPAP"    Hgb Electro      CF      SMA                    Preterm labor symptoms and general obstetric precautions including but not limited to vaginal bleeding, contractions, leaking of fluid and fetal movement were reviewed in detail with the patient.  Please refer to After Visit Summary for other counseling recommendations.   Return in about 2 weeks (around 06/24/2023) for ROB.  28 wks labs, tdap and Flu completed today   Jannifer Hick  [facility]  06/10/23  10:59 AM

## 2023-06-10 NOTE — Patient Instructions (Signed)
Here is information on Symphysis pubis dysfunction :  [email or web address].[email or web address]

## 2023-06-12 LAB — 28 WEEK RH+PANEL
Basophils Absolute: 0 10*3/uL (ref 0.0–0.2)
Basos: 0 %
EOS (ABSOLUTE): 0.1 10*3/uL (ref 0.0–0.4)
Eos: 1 %
Gestational Diabetes Screen: 162 mg/dL — ABNORMAL HIGH (ref 70–139)
HIV Screen 4th Generation wRfx: NONREACTIVE
Hematocrit: 37.1 % (ref 34.0–46.6)
Hemoglobin: 12.3 g/dL (ref 11.1–15.9)
Immature Grans (Abs): 0.1 10*3/uL (ref 0.0–0.1)
Immature Granulocytes: 1 %
Lymphocytes Absolute: 2.7 10*3/uL (ref 0.7–3.1)
Lymphs: 26 %
MCH: 28.3 pg (ref 26.6–33.0)
MCHC: 33.2 g/dL (ref 31.5–35.7)
MCV: 85 fL (ref 79–97)
Monocytes Absolute: 0.9 10*3/uL (ref 0.1–0.9)
Monocytes: 9 %
Neutrophils Absolute: 6.6 10*3/uL (ref 1.4–7.0)
Neutrophils: 63 %
Platelets: 213 10*3/uL (ref 150–450)
RBC: 4.35 x10E6/uL (ref 3.77–5.28)
RDW: 13.4 % (ref 11.7–15.4)
RPR Ser Ql: NONREACTIVE
WBC: 10.4 10*3/uL (ref 3.4–10.8)

## 2023-06-13 ENCOUNTER — Ambulatory Visit: Payer: 59 | Attending: Licensed Practical Nurse | Admitting: Physical Therapy

## 2023-06-13 DIAGNOSIS — R2689 Other abnormalities of gait and mobility: Secondary | ICD-10-CM | POA: Insufficient documentation

## 2023-06-13 DIAGNOSIS — O9A213 Injury, poisoning and certain other consequences of external causes complicating pregnancy, third trimester: Secondary | ICD-10-CM | POA: Insufficient documentation

## 2023-06-13 DIAGNOSIS — M899 Disorder of bone, unspecified: Secondary | ICD-10-CM | POA: Insufficient documentation

## 2023-06-13 DIAGNOSIS — Z3A28 28 weeks gestation of pregnancy: Secondary | ICD-10-CM | POA: Diagnosis not present

## 2023-06-13 DIAGNOSIS — S334XXA Traumatic rupture of symphysis pubis, initial encounter: Secondary | ICD-10-CM | POA: Diagnosis not present

## 2023-06-13 DIAGNOSIS — M533 Sacrococcygeal disorders, not elsewhere classified: Secondary | ICD-10-CM | POA: Diagnosis not present

## 2023-06-13 DIAGNOSIS — Z3480 Encounter for supervision of other normal pregnancy, unspecified trimester: Secondary | ICD-10-CM | POA: Insufficient documentation

## 2023-06-13 NOTE — Patient Instructions (Signed)
  Lengthen Back rib by L  shoulder ( winging)    Lie on R  side , pillow between knees and under head  Pull  L arm overhead over mattress, grab the edge of mattress,pull it upward, drawing elbow away from ears  Breathing 10 reps  Brushing arm with 3/4 turn onto pillow behind back  Lying on R  side ,Pillow/ Block between knees     dragging top forearm across ribs below breast rotating 3/4 turn,  rotating  _L_ only this week ,  relax onto the pillow behind the back  and then back to other palm , maintain top palm on body whole top and not lift shoulder  Do this side this week       Wait do both sides until we have levelled out your spine and shoulders ___   Lengthen Back rib by R  shoulder   ( winging)    Lie on L  side , pillow between knees and under head  Pull  R arm overhead over mattress, grab the edge of mattress,pull it upward, drawing elbow away from ears  Breathing 10 reps  Brushing arm with 3/4 turn onto pillow behind back  Lying on L  side ,Pillow/ Block between knees     dragging top forearm across ribs below breast rotating 3/4 turn,  rotating  _R_ only this week ,  relax onto the pillow behind the back  and then back to other palm , maintain top palm on body whole top and not lift shoulder  Do this side this week       Wait do both sides until we have levelled out your spine and shoulders  ___   Avoid straining pelvic floor, abdominal muscles , spine  Use log rolling technique instead of getting out of bed with your neck or the sit-up     Log rolling into and out of bed   Log rolling into and out of bed If getting out of bed on R side, Bent knees, scoot hips/ shoulder to L  Raise R arm completely overhead, rolling onto armpit  Then lower bent knees to bed to get into complete side lying position  Then drop legs off bed, and push up onto R elbow/forearm, and use L hand to push onto the bed    Dig elbows and feet to lift hte buttocks and scoot  without lifting head   __   Proper body mechanics with getting out of a chair to decrease strain  on back &pelvic floor   Avoid holding your breath when Getting out of the chair:  Scoot to front part of chair chair Heels behind knees, feet are hip width apart, nose over toes  Inhale like you are smelling roses Exhale to stand

## 2023-06-13 NOTE — Therapy (Signed)
OUTPATIENT PHYSICAL THERAPY EVALUATION   Patient Name: Martha Tucker MRN: [id number] DOB:[birth year], 32 y.o., female Today's Date: 06/13/2023   PT End of Session - 06/13/23 0937     Visit Number 1    Number of Visits 10    Date for PT Re-Evaluation 08/22/23    PT Start Time 0935    PT Stop Time 1015    PT Time Calculation (min) 40 min    Activity Tolerance Patient tolerated treatment well;No increased pain    Behavior During Therapy WFL for tasks assessed/performed             Past Medical History:  Diagnosis Date   Genital herpes    History of abnormal cervical Pap smear    Past Surgical History:  Procedure Laterality Date   TONSILLECTOMY     Patient Active Problem List   Diagnosis Date Noted   Supervision of other normal pregnancy, antepartum 01/18/2023   Overweight BMI=26.2 11/23/2020   Smoker 1/2-3/4 ppd 11/23/2020   Genital herpes simplex type 2 06/13/2017    PCP:   REFERRING PROVIDER: Dominic , CNM   REFERRING DIAG: - Dislocation of symphysis pubis, initial  Rationale for Evaluation and Treatment Rehabilitation  THERAPY DIAG:  Pubic bone pain  Sacrococcygeal disorders, not elsewhere classified  Other abnormalities of gait and mobility  ONSET DATE:  SUBJECTIVE:                                                                                                                                                                                           SUBJECTIVE STATEMENT: 1) pubic pain:  Pt is [redacted] weeks pregnant with her 3rd child. Pt started to feel pubic pain started 2 weeks like something about fall out. It felt like a lot of pressure in the groin area. R more than L. Worst 9/10 with all her feet for long period of time 4-5 hours and she is not able to walk, Pt works 12 hr shift.  Eases with a warm rag 4/10.    2) SUI with laughing   3) constipation :  pt is really not strain. Pt has BM every other day and sometimes every 2 days. Daily fluid  intake: 48 fl oz of water, one cup of coffee, 1 cup of tea every other day  Pt had bad hemorrhoids with her last pregnancy and blodd clot hemorrhoids with surgery with there first child . Pt had  perineal scars with the first child.   PERTINENT HISTORY:  Past 2 children were deliveries. Pt had bad hemorrhoids with her last pregnancy and blood clot hemorrhoids with surgery with there first  child . Pt had  perineal scars with the first child.   PAIN:  Are you having pain? Yes: see above   PRECAUTIONS: Pregnancy precautions  WEIGHT BEARING RESTRICTIONS: No  FALLS:  Has patient fallen in last 6 months? 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 R pelvis higher    Palpation comment hypomobilty thoracic spine. C/T junction deviated to L ,             [facility] Adult PT Treatment/Exercise - 06/13/23 0949       Ambulation/Gait   Gait Comments 0.9 m/s with decreased stance on L, excessive pelvic sway to L, trunk lean to L , ( post Tx: 1.28 m/s reciprocal gait , shoulder still lowered L)      Neuro Re-ed    Neuro Re-ed Details  cued for body mechanics with sit to stand, log rolling in bed, plan to address work bodymechanics at work . Cued for mobility HEP to realign spine      Modalities   Modalities Moist Heat      Moist Heat Therapy   Number Minutes Moist Heat 5 Minutes    Moist Heat Location --   cervical/ thoracic ( in sidelying)     Manual Therapy   Manual therapy comments STM/MWM at problem at areas noted in assessment to realign spine    Technique modified with pregnancy precautions  heeded. ( Sidelying)               HOME EXERCISE PROGRAM: See pt instruction section    ASSESSMENT:  CLINICAL IMPRESSION:  Pt is a 32 year old and is [redacted] weeks pregnant with her 3rd child. She presents with pubic pain, SUI with laughing, constipation which impact QOL, ADL, fitness, and community activities.   Pt's musculoskeletal assessment revealed uneven pelvic girdle and shoulder height, asymmetries to gait pattern, limited spinal /pelvic mobility, dyscoordination and strength of pelvic floor mm, hip weakness, poor body mechanics which places strain on the abdominal/pelvic floor mm. These are deficits that indicate an ineffective intraabdominal pressure system associated with increased risk for pt's Sx.    Pt was provided education on etiology of Sx with anatomy, physiology explanation with images along with the benefits of customized pelvic PT Tx based on pt's medical conditions and musculoskeletal deficits.  Explained the physiology of deep core mm coordination and roles of pelvic floor function in urination, defecation, sexual function, and postural control with deep core mm system.   Regional interdependent approaches will yield greater benefits..   Following Tx today which pt tolerated without complaints,  pt demo'd proper body mechanics to minimize straining pelvic floor and promote pelvic girdle stability Pt also responded positively to manual Tx which helped with realignment of her pelvic girdle and spine.   Precautions for pregnancy were heeded. Gait speed increased also increased. Pt reported decreased pain from 3/10 pain level to 0/10.     Plan to address realignment of spine at L upper spine at next  session to help promote optimize IAP system for improved pelvic floor function, trunk stability, gait, balance, stabilization with mobility tasks.  Plan to address work body mechanics next session to minimize asymmetries to the spine and promote more pelvic girdle stability  throughout pregnancy.   Pt benefits from skilled PT.    OBJECTIVE IMPAIRMENTS decreased activity tolerance, decreased coordination, decreased endurance, decreased mobility, difficulty walking, decreased ROM, decreased strength, decreased safety awareness, hypomobility, increased muscle spasms, impaired flexibility, improper body mechanics, postural dysfunction, and pain. scar restrictions   ACTIVITY LIMITATIONS  self-care,  sleep, home chores, work tasks    PARTICIPATION LIMITATIONS:  community,   PERSONAL FACTORS        are also affecting patient's functional outcome.    REHAB POTENTIAL: Good   CLINICAL DECISION MAKING: Evolving/moderate complexity   EVALUATION COMPLEXITY: Low     PATIENT EDUCATION:    Education details: Showed pt anatomy images. Explained muscles attachments/ connection, physiology of deep core system/ spinal- thoracic-pelvis-lower kinetic chain as they relate to pt's presentation, Sx, and past Hx. Explained what and how these areas of deficits need to be restored to balance and function    See Therapeutic activity / neuromuscular re-education section  Answered pt's questions.   Person educated: Patient Education method: Explanation, Demonstration, Tactile cues, Verbal cues, and Handouts Education comprehension: verbalized understanding, returned demonstration, verbal cues required, tactile cues required, and needs further education     PLAN: PT FREQUENCY: 1x/week   PT DURATION: 10 weeks   PLANNED INTERVENTIONS: Therapeutic exercises, Therapeutic activity, Neuromuscular re-education, Balance training, Gait training, Patient/Family education, Self Care, Joint mobilization, Spinal mobilization, Moist heat, Taping, and Manual therapy, dry needling.   PLAN FOR NEXT SESSION: See clinical impression for plan     GOALS: Goals reviewed with patient? Yes  SHORT TERM GOALS: Target date: 07/11/2023    Pt will demo IND with HEP                    Baseline:  Not IND            Goal status: INITIAL   LONG TERM GOALS: Target date: 08/22/2023    1.Pt will demo proper deep core coordination without chest breathing and optimal excursion of diaphragm/pelvic floor in order to promote spinal stability and pelvic floor function  Baseline: dyscoordination Goal status: INITIAL  2.  Pt will demo > 5 pt change on FOTO  to improve QOL and function   Pelvic Pain baseline -17 pts  Lower score = better function   Goal status: INITIAL  3.  Pt will demo proper body mechanics in against gravity tasks and ADLs  work tasks, fitness  to minimize straining pelvic floor / back    Baseline: not IND, improper form that places strain on pelvic floor  Goal status: INITIAL    4. Pt will demo increased gait speed > 1.3 m/s with reciprocal gait pattern, longer stride length  in order to ambulate safely in community and return to fitness routine  Baseline:  Goal status: INITIAL   5. Pt will demo increased gait speed > 1.3 m/s in order to ambulate safely in community and return to fitness routine  Baseline: 0.9 m/s with decreased stance on L, excessive pelvic sway to L, trunk lean to L , Goal status: INITIAL   6. Pt will demo levelled pelvic girdle and shoulder height in order to progress to deep core strengthening HEP and restore mobility at spine, pelvis, gait, posture minimize falls,  and improve balance   Baseline: R shoulder and pelvis higher  Goal status: INITIAL     Mariane Masters, PT 06/13/2023, 9:40 AM

## 2023-06-21 ENCOUNTER — Telehealth: Payer: Self-pay

## 2023-06-21 NOTE — Telephone Encounter (Signed)
I was reviewing MMFs schedule for 06/24/23. Failed 1 hr GTT 162, I do not see msg or telephone encounter of pt notified? I called pt to ask if someone had called her, if not, so we can hopefully get her to do 3 hr GTT on Monday, no answer. Just confirming with you if you talked to pt regarding results?

## 2023-06-24 ENCOUNTER — Ambulatory Visit (INDEPENDENT_AMBULATORY_CARE_PROVIDER_SITE_OTHER): Payer: 59 | Admitting: Obstetrics

## 2023-06-24 VITALS — BP 97/63 | HR 94 | Wt 162.0 lb

## 2023-06-24 DIAGNOSIS — Z131 Encounter for screening for diabetes mellitus: Secondary | ICD-10-CM

## 2023-06-24 DIAGNOSIS — Z3480 Encounter for supervision of other normal pregnancy, unspecified trimester: Secondary | ICD-10-CM

## 2023-06-24 DIAGNOSIS — Z3A30 30 weeks gestation of pregnancy: Secondary | ICD-10-CM

## 2023-06-24 LAB — POCT URINALYSIS DIPSTICK OB
Bilirubin, UA: NEGATIVE
Blood, UA: 1
Glucose, UA: NEGATIVE
Leukocytes, UA: NEGATIVE
Nitrite, UA: NEGATIVE
Spec Grav, UA: 1.015 (ref 1.010–1.025)
Urobilinogen, UA: 0.2 U/dL
pH, UA: 5 (ref 5.0–8.0)

## 2023-06-24 NOTE — Progress Notes (Signed)
Routine Prenatal Care Visit  Subjective  Martha Tucker is a 32 y.o. G3P2002 at [redacted]w[redacted]d being seen today for ongoing prenatal care.  She is currently monitored for the following issues for this low-risk pregnancy and has Genital herpes simplex type 2; Overweight BMI=26.2; Smoker 1/2-3/4 ppd; and Supervision of other normal pregnancy, antepartum on their problem list.  ----------------------------------------------------------------------------------- Patient reports no complaints.  Her 1hr GTT results was 162- she was not notified of the result and is not prepared todo a 3 hr test this mmorning.  .  .   Pincus Large Fluid denies.  ----------------------------------------------------------------------------------- The following portions of the patient's history were reviewed and updated as appropriate: allergies, current medications, past family history, past medical history, past social history, past surgical history and problem list. Problem list updated.  Objective  Last menstrual period 11/22/2022. Pregravid weight 136 lb (61.7 kg) Total Weight Gain 28 lb 4.8 oz (12.8 kg) Urinalysis: Urine Protein    Urine Glucose    Fetal Status:           General:  Alert, oriented and cooperative. Patient is in no acute distress.  Skin: Skin is warm and dry. No rash noted.   Cardiovascular: Normal heart rate noted  Respiratory: Normal respiratory effort, no problems with respiration noted  Abdomen: Soft, gravid, appropriate for gestational age.       Pelvic:  Cervical exam deferred        Extremities: Normal range of motion.     Mental Status: Normal mood and affect. Normal behavior. Normal judgment and thought content.   Assessment   32 y.o. [id number] at [redacted]w[redacted]d by  08/29/2023, Date entered prior to episode creation presenting for routine prenatal visit Elevated 1 hr GTT-162 Needs 3 hr testing. Plan   third Problems (from 01/18/23 to present)     Problem Noted Resolved   Supervision of other  normal pregnancy, antepartum 01/18/2023 by Loran Senters, CMA No   Overview Addendum 06/10/2023 10:46 AM by Burtis Junes, CMA     Clinical Staff Provider  Office Location  [facility] Dating  L and 9wk [place]  Language  English Anatomy US    Flu Vaccine  06/10/2023 Genetic Screen  NIPS: Negative/Female (02/08/23)  TDaP vaccine   06/10/2023 Hgb A1C or  GTT Early : Third trimester :   Covid One dose   LAB RESULTS   Rhogam  O/Positive/-- (06/14 0931)  Blood Type O/Positive/-- (06/14 0931)   Feeding Plan formula Antibody Negative (06/14 0931)  Contraception nexplanon Rubella 1.50 (06/14 0931)  Circumcision yes RPR Non Reactive (06/14 0931)   Pediatrician  [facility] HBsAg Negative (06/14 0931)   Support Person Weston Brass HIV Non Reactive (06/14 0931)  Prenatal Classes Yes if virtual Varicella Immune    GBS  (For PCN allergy, check sensitivities)   BTL Consent  Hep C Non Reactive (06/14 0931)   VBAC Consent  Pap No results found for: "DIAGPAP"    Hgb Electro      CF      SMA                    Preterm labor symptoms and general obstetric precautions including but not limited to vaginal bleeding, contractions, leaking of fluid and fetal movement were reviewed in detail with the patient. Please refer to After Visit Summary for other counseling recommendations.  Will schedule her for a 3 hr test this week.  Return in about 2 weeks (around 07/08/2023) for return OB,  review 3 hr GTT results.  Mirna Mires, CNM  06/24/2023 8:15 AM

## 2023-06-24 NOTE — Telephone Encounter (Signed)
Pt scheduled for 3 hr GTT on 06/27/23.

## 2023-06-26 ENCOUNTER — Ambulatory Visit: Payer: 59 | Admitting: Physical Therapy

## 2023-06-26 DIAGNOSIS — M533 Sacrococcygeal disorders, not elsewhere classified: Secondary | ICD-10-CM | POA: Diagnosis not present

## 2023-06-26 DIAGNOSIS — Z3A28 28 weeks gestation of pregnancy: Secondary | ICD-10-CM | POA: Diagnosis not present

## 2023-06-26 DIAGNOSIS — M899 Disorder of bone, unspecified: Secondary | ICD-10-CM | POA: Diagnosis not present

## 2023-06-26 DIAGNOSIS — R2689 Other abnormalities of gait and mobility: Secondary | ICD-10-CM | POA: Diagnosis not present

## 2023-06-26 DIAGNOSIS — O9A213 Injury, poisoning and certain other consequences of external causes complicating pregnancy, third trimester: Secondary | ICD-10-CM | POA: Diagnosis not present

## 2023-06-26 DIAGNOSIS — S334XXA Traumatic rupture of symphysis pubis, initial encounter: Secondary | ICD-10-CM | POA: Diagnosis not present

## 2023-06-26 NOTE — Therapy (Signed)
OUTPATIENT PHYSICAL THERAPY TREATMENT   Patient Name: Martha Tucker MRN: [id number] DOB:07/16/1991, 32 y.o., female Today's Date: 06/26/2023   PT End of Session - 06/26/23 0200     Visit Number 2    Number of Visits 10    Date for PT Re-Evaluation 08/22/23    PT Start Time 0941    PT Stop Time 1020    PT Time Calculation (min) 39 min    Activity Tolerance Patient tolerated treatment well;No increased pain    Behavior During Therapy WFL for tasks assessed/performed             Past Medical History:  Diagnosis Date   Genital herpes    History of abnormal cervical Pap smear    Past Surgical History:  Procedure Laterality Date   TONSILLECTOMY     Patient Active Problem List   Diagnosis Date Noted   Supervision of other normal pregnancy, antepartum 01/18/2023   Overweight BMI=26.2 11/23/2020   Smoker 1/2-3/4 ppd 11/23/2020   Genital herpes simplex type 2 06/13/2017    PCP:   REFERRING PROVIDER: Dominic , CNM   REFERRING DIAG: - Dislocation of symphysis pubis, initial  Rationale for Evaluation and Treatment Rehabilitation  THERAPY DIAG:  Pubic bone pain  Sacrococcygeal disorders, not elsewhere classified  Other abnormalities of gait and mobility  ONSET DATE:  SUBJECTIVE:                  SUBJECTIVE STATEMENT TODAY: Pt only felt her pubic pain 1x/ week across the last 2 weeks compared 3 x per week . Pt is going on hikes with sister and mom for 30 min to 1 hr                                                                                                                                                                           SUBJECTIVE STATEMENT ON 06/13/23 : 1) pubic pain:  Pt is [redacted] weeks pregnant with her 3rd child. Pt started to feel pubic pain started 2 weeks like something about fall out. It felt like a lot of pressure in the groin area. R more than L. Worst 9/10 with all her feet for long period of time 4-5 hours and she is not able to walk, Pt  works 12 hr shift.  Eases with a warm rag 4/10.    2) SUI with laughing   3) constipation :  pt is really not strain. Pt has BM every other day and sometimes every 2 days. Daily fluid intake: 48 fl oz of water, one cup of coffee, 1 cup of tea every other day  Pt had bad hemorrhoids with her last pregnancy and blodd clot hemorrhoids with surgery  with there first child . Pt had  perineal scars with the first child.   PERTINENT HISTORY:  Past 2 children were deliveries. Pt had bad hemorrhoids with her last pregnancy and blood clot hemorrhoids with surgery with there first child . Pt had  perineal scars with the first child.   PAIN:  Are you having pain? Yes: see above   PRECAUTIONS: Pregnancy precautions  WEIGHT BEARING RESTRICTIONS: No  FALLS:  Has patient fallen in last 6 months? 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    PLOF: IND   PATIENT GOALS:    OBJECTIVE:   [facility] PT Assessment - 06/26/23 1028       Strength   Overall Strength Comments Hip abd 3/5 B      Palpation   SI assessment  levelled pelvis, R shoudler slightly higher    Palpation comment hypomobile at T7, 12, L2      Ambulation/Gait   Gait Comments 1.36 m/s with reciporcal gait pattern, longer stride                 [facility] Adult PT Treatment/Exercise - 06/26/23 0953       Ambulation/Gait   Gait Comments 1.36 m/s with reciporcal gait pattern, longer stride      Therapeutic Activites    Therapeutic Activities Other Therapeutic Activities    Other Therapeutic Activities instructed on wearing SIJ , Pt demo'd IND      Neuro Re-ed    Neuro Re-ed Details  cued for getting into car and shower with butt first, moving knee incrementally to prevent pubic pain , cued for clam and stretches      Modalities   Modalities Moist Heat       Moist Heat Therapy   Number Minutes Moist Heat 5 Minutes    Moist Heat Location --   thoracic, sideyling, , knees propped     Manual Therapy   Manual therapy comments STM/MWM at problem at areas noted in assessment to realign spine                HOME EXERCISE PROGRAM: See pt instruction section    ASSESSMENT:  CLINICAL IMPRESSION:   Improvements at Visit 2: Pt only felt her pubic pain 1x/ week across the last 2 weeks compared 3 x per week.  Gait speed improved signficantly this week with levelled and realigned pelvic girdle.  1.36 m/s with reciporcal gait pattern, longer stride from previously  0.9 m/s with decreased stance on L, excessive pelvic sway to L, trunk lean to L.   Pt showed levelled pelvis alignment but R shoulder still slightly higher with hypomobile thoracic segments.  Manual Tx was required to further  address L upper spine to help promote optimize IAP system for improved pelvic floor function, trunk stability, gait, balance, stabilization with mobility tasks.    Due to weakness, added hip abduction strengthening with cues for proper technique. Pt demo'd correctly post training.   Provided SIJ belt pro-brono which pt demo'd IND with don/dof after instructions. It will provide more pelvic girdle stability. Instructed on body mechanics with getting into car/ shower to prevent pubic symphysis separation.  Plan to address work body mechanics next session to minimize asymmetries to the spine and promote more pelvic girdle stability throughout pregnancy.   Pt benefits  from skilled PT.    OBJECTIVE IMPAIRMENTS decreased activity tolerance, decreased coordination, decreased endurance, decreased mobility, difficulty walking, decreased ROM, decreased strength, decreased safety awareness, hypomobility, increased muscle spasms, impaired flexibility, improper body mechanics, postural dysfunction, and pain. scar restrictions   ACTIVITY LIMITATIONS  self-care,   sleep, home chores, work tasks    PARTICIPATION LIMITATIONS:  community,   PERSONAL FACTORS        are also affecting patient's functional outcome.    REHAB POTENTIAL: Good   CLINICAL DECISION MAKING: Evolving/moderate complexity   EVALUATION COMPLEXITY: Low     PATIENT EDUCATION:    Education details: Showed pt anatomy images. Explained muscles attachments/ connection, physiology of deep core system/ spinal- thoracic-pelvis-lower kinetic chain as they relate to pt's presentation, Sx, and past Hx. Explained what and how these areas of deficits need to be restored to balance and function    See Therapeutic activity / neuromuscular re-education section  Answered pt's questions.   Person educated: Patient Education method: Explanation, Demonstration, Tactile cues, Verbal cues, and Handouts Education comprehension: verbalized understanding, returned demonstration, verbal cues required, tactile cues required, and needs further education     PLAN: PT FREQUENCY: 1x/week   PT DURATION: 10 weeks   PLANNED INTERVENTIONS: Therapeutic exercises, Therapeutic activity, Neuromuscular re-education, Balance training, Gait training, Patient/Family education, Self Care, Joint mobilization, Spinal mobilization, Moist heat, Taping, and Manual therapy, dry needling.   PLAN FOR NEXT SESSION: See clinical impression for plan     GOALS: Goals reviewed with patient? Yes  SHORT TERM GOALS: Target date: 07/11/2023    Pt will demo IND with HEP                    Baseline: Not IND            Goal status: INITIAL   LONG TERM GOALS: Target date: 08/22/2023    1.Pt will demo proper deep core coordination without chest breathing and optimal excursion of diaphragm/pelvic floor in order to promote spinal stability and pelvic floor function  Baseline: dyscoordination Goal status: INITIAL  2.  Pt will demo > 5 pt change on FOTO  to improve QOL and function   Pelvic Pain baseline -17 pts  Lower  score = better function   Goal status: INITIAL  3.  Pt will demo proper body mechanics in against gravity tasks and ADLs  work tasks, fitness  to minimize straining pelvic floor / back    Baseline: not IND, improper form that places strain on pelvic floor  Goal status: INITIAL    4. Pt will demo proper coordination of pelvic floor to minimize leakage  Baseline: leakage with coughing, laughing, sneezing  Goal status: INITIAL   5. Pt will demo increased gait speed > 1.3 m/s in order to ambulate safely in community and return to fitness routine  Baseline: 0.9 m/s with decreased stance on L, excessive pelvic sway to L, trunk lean to L , Goal status: MET ( 06/26/23: visit #2: 1.36 m/s with reciporcal gait pattern, longer stride    6. Pt will demo levelled pelvic girdle and shoulder height in order to progress to deep core strengthening HEP and restore mobility at spine, pelvis, gait, posture minimize falls, and improve balance   Baseline: R shoulder and pelvis higher  Goal status: MET      Mariane Masters, PT 06/26/2023, 9:50 AM

## 2023-06-26 NOTE — Patient Instructions (Addendum)
   Clam Shell 45 Degrees  Lying with hips and knees bent 45, one pillow between knees and ankles. Heel together, toes apart like ballerina,  Lift knee with exhale while pressing heels together. Be sure pelvis does not roll backward. Do not arch back. Do 20 times, each leg, 2 times per day.    Complimentary stretch:   Sit at 45 deg turn with R leg and knee on edge of chair/ bench, L buttock hanging off the edge to bring the L foot back like a lunge, toes bent, lower heel to feel quad stretch,  pay attention to keeping pinky and first toe ballmound planted to align toes forward so ankles are not twerked   Repeat with other side   ___  Wear SIJ belt

## 2023-06-27 ENCOUNTER — Other Ambulatory Visit: Payer: 59

## 2023-06-27 DIAGNOSIS — Z131 Encounter for screening for diabetes mellitus: Secondary | ICD-10-CM | POA: Diagnosis not present

## 2023-06-28 LAB — GESTATIONAL GLUCOSE TOLERANCE
Glucose, Fasting: 75 mg/dL (ref 70–94)
Glucose, GTT - 1 Hour: 162 mg/dL (ref 70–179)
Glucose, GTT - 2 Hour: 128 mg/dL (ref 70–154)
Glucose, GTT - 3 Hour: 73 mg/dL (ref 70–139)

## 2023-07-01 ENCOUNTER — Ambulatory Visit: Payer: 59 | Admitting: Physical Therapy

## 2023-07-08 ENCOUNTER — Encounter: Payer: Self-pay | Admitting: Certified Nurse Midwife

## 2023-07-08 ENCOUNTER — Ambulatory Visit (INDEPENDENT_AMBULATORY_CARE_PROVIDER_SITE_OTHER): Payer: 59 | Admitting: Certified Nurse Midwife

## 2023-07-08 VITALS — BP 117/74 | HR 101 | Wt 165.0 lb

## 2023-07-08 DIAGNOSIS — O99213 Obesity complicating pregnancy, third trimester: Secondary | ICD-10-CM

## 2023-07-08 DIAGNOSIS — O98313 Other infections with a predominantly sexual mode of transmission complicating pregnancy, third trimester: Secondary | ICD-10-CM

## 2023-07-08 DIAGNOSIS — Z3480 Encounter for supervision of other normal pregnancy, unspecified trimester: Secondary | ICD-10-CM

## 2023-07-08 DIAGNOSIS — A6000 Herpesviral infection of urogenital system, unspecified: Secondary | ICD-10-CM

## 2023-07-08 DIAGNOSIS — Z23 Encounter for immunization: Secondary | ICD-10-CM | POA: Diagnosis not present

## 2023-07-08 DIAGNOSIS — Z3A32 32 weeks gestation of pregnancy: Secondary | ICD-10-CM

## 2023-07-08 DIAGNOSIS — E663 Overweight: Secondary | ICD-10-CM

## 2023-07-08 DIAGNOSIS — Z2911 Encounter for prophylactic immunotherapy for respiratory syncytial virus (RSV): Secondary | ICD-10-CM

## 2023-07-08 MED ORDER — VALACYCLOVIR HCL 500 MG PO TABS: 500.0000 mg | ORAL_TABLET | Freq: Two times a day (BID) | ORAL | 1 refills | Status: DC

## 2023-07-08 NOTE — Patient Instructions (Signed)

## 2023-07-08 NOTE — Progress Notes (Signed)
    Return Prenatal Note   Subjective   32 y.o. [id number] at [redacted]w[redacted]d presents for this follow-up prenatal visit.  Anelise feeling well, active baby. Desires RSV vaccine today. Plans nexplanon before discharge home. Planning epidural for pain management. Discussed Valtrex for suppression, prescribed today. No outbreaks in pregnancy. Patient reports: Movement: Present Contractions: Not present  Objective   Flow sheet Vitals: Pulse Rate: (!) 101 BP: 117/74 Fundal Height: 33 cm Fetal Heart Rate (bpm): 150 Presentation: Vertex Total weight gain: 29 lb (13.2 kg)  General Appearance  No acute distress, well appearing, and well nourished Pulmonary   Normal work of breathing Neurologic   Alert and oriented to person, place, and time Psychiatric   Mood and affect within normal limits  Assessment/Plan   Plan  32 y.o. [id number] at [redacted]w[redacted]d presents for follow-up OB visit. Reviewed prenatal record including previous visit note.  1. Supervision of other normal pregnancy, antepartum - Respiratory syncytial virus vaccine, preF, subunit, bivalent,(Abrysvo)  2. Overweight BMI=26.2 - Respiratory syncytial virus vaccine, preF, subunit, bivalent,(Abrysvo)  3. [redacted] weeks gestation of pregnancy - Respiratory syncytial virus vaccine, preF, subunit, bivalent,(Abrysvo)  4. Need for RSV immunization - Respiratory syncytial virus vaccine, preF, subunit, bivalent,(Abrysvo)  5. Genital herpes simplex type 2 - valACYclovir (VALTREX) 500 MG tablet; Take 1 tablet (500 mg total) by mouth 2 (two) times daily.  Dispense: 180 tablet; Refill: 1     Orders Placed This Encounter  Procedures   Respiratory syncytial virus vaccine, preF, subunit, bivalent,(Abrysvo)   Return in 2 weeks (on 07/22/2023) for ROB.   Future Appointments  Date Time Provider Department Center  07/09/2023  8:00 AM Mariane Masters, PT [facility] None  08/13/2023 10:15 AM Mariane Masters, PT [facility] None    For next visit:   continue with routine prenatal care     Dominica Severin, CNM  11/11/248:32 AM

## 2023-07-09 ENCOUNTER — Ambulatory Visit: Payer: 59 | Attending: Licensed Practical Nurse | Admitting: Physical Therapy

## 2023-07-09 ENCOUNTER — Telehealth: Payer: Self-pay | Admitting: Physical Therapy

## 2023-07-09 NOTE — Telephone Encounter (Signed)
Physical therapist called pt re: missed appt today. Provided encouragement to keep up with [facility].  Left message that there is no charge to missed appts but there is a waiting list with other patients needing treatment at our clinic.    We would be grateful if pt can please call to confirm whether they wish to keep or cancel remaining appts [phone].  Remaining appts will be removed after 2 no-show appts per rehab department policy.   If pt wants to schedule more appts, they will be scheduled one at a time.

## 2023-07-20 ENCOUNTER — Observation Stay
Admission: EM | Admit: 2023-07-20 | Discharge: 2023-07-20 | Disposition: A | Discharge status: Home / Self Care | Payer: 59 | Attending: Obstetrics and Gynecology | Admitting: Obstetrics and Gynecology

## 2023-07-20 ENCOUNTER — Other Ambulatory Visit: Payer: Self-pay

## 2023-07-20 ENCOUNTER — Encounter: Payer: Self-pay | Admitting: Certified Nurse Midwife

## 2023-07-20 DIAGNOSIS — O368130 Decreased fetal movements, third trimester, not applicable or unspecified: Secondary | ICD-10-CM | POA: Insufficient documentation

## 2023-07-20 DIAGNOSIS — O26893 Other specified pregnancy related conditions, third trimester: Principal | ICD-10-CM | POA: Insufficient documentation

## 2023-07-20 DIAGNOSIS — Z3A34 34 weeks gestation of pregnancy: Secondary | ICD-10-CM | POA: Insufficient documentation

## 2023-07-20 DIAGNOSIS — Z3480 Encounter for supervision of other normal pregnancy, unspecified trimester: Principal | ICD-10-CM

## 2023-07-20 DIAGNOSIS — O23593 Infection of other part of genital tract in pregnancy, third trimester: Secondary | ICD-10-CM | POA: Diagnosis not present

## 2023-07-20 DIAGNOSIS — N898 Other specified noninflammatory disorders of vagina: Secondary | ICD-10-CM | POA: Diagnosis not present

## 2023-07-20 LAB — WET PREP, GENITAL
Clue Cells Wet Prep HPF POC: NONE SEEN
Sperm: NONE SEEN
Trich, Wet Prep: NONE SEEN
WBC, Wet Prep HPF POC: <10 (ref <10)

## 2023-07-20 MED ORDER — ACETAMINOPHEN 325 MG PO TABS: 650.0000 mg | ORAL_TABLET | ORAL | Status: DC | PRN

## 2023-07-20 MED ORDER — ONDANSETRON HCL 4 MG/2ML IJ SOLN: 4.0000 mg | Freq: Four times a day (QID) | INTRAMUSCULAR | Status: DC | PRN

## 2023-07-20 MED ORDER — SOD CITRATE-CITRIC ACID 500-334 MG/5ML PO SOLN
30.0000 mL | ORAL | Status: DC | PRN
Start: 2023-07-20 — End: 2023-07-21

## 2023-07-20 MED ORDER — FLUCONAZOLE 50 MG PO TABS
150.0000 mg | ORAL_TABLET | Freq: Once | ORAL | Status: AC
  Administered 2023-07-20: 150 mg via ORAL
  Filled 2023-07-20: qty 1

## 2023-07-20 NOTE — Discharge Summary (Signed)
LABOR & DELIVERY OB TRIAGE NOTE  SUBJECTIVE  HPI Martha Tucker is a 32 y.o. G3P2002 at [redacted]w[redacted]d who presents to Labor & Delivery for increased vaginal discharge, concerned for losing mucous plug. She denies contractions, loss of fluid or vaginal bleeding. Endorses fetal movement. She reports she lost a large piece of mucous earlier today.  OB History     Gravida  3   Para  2   Term  2   Preterm  0   AB  0   Living  2      SAB  0   IAB  0   Ectopic  0   Multiple      Live Births  2           Scheduled Meds: Continuous Infusions: PRN Meds:.acetaminophen, ondansetron, sodium citrate-citric acid  OBJECTIVE  BP 116/75 (BP Location: Right Arm)   Pulse 88   Temp 97.9 F (36.6 C) (Oral)   Resp 16   Ht 5\' 1"  (1.549 m)   Wt 74.8 kg   LMP 11/22/2022 (Exact Date)   BMI 31.18 kg/m   General: A&Ox4, NAD Heart: regular rate Lungs: normal effort Abdomen: gravid, soft, nontender Cervical exam: Dilation: Closed Effacement (%): 40 Cervical Position: Posterior Station: -2 Presentation: Vertex Exam by:: Keitha Butte, CNM   NST I reviewed the NST and it was reactive.  Baseline: 140 Variability: moderate Accelerations: present Decelerations:none Toco: irritability, soft resting tone Category I  ASSESSMENT Impression  1) Pregnancy at G3P2002, [redacted]w[redacted]d, Estimated Date of Delivery: 08/29/23 2) Reassuring maternal/fetal status 3) Vulvovaginal candidiasis in third trimester  PLAN Wet prep positive for yeast, treated with single dose diflucan prior to discharge. Discharge home with preterm labor precautions. Maintain next visit as scheduled. Dominica Severin, CNM 07/20/23  7:37 PM

## 2023-07-20 NOTE — OB Triage Note (Signed)
Pt reports having lost her mucus plug this afternoon. Denies ctx today but was having Deberah Pelton last week. Denies vaginal bleeding. Audible fetal movement noted. Reports having sex Wednesday. Elaina Hoops

## 2023-07-20 NOTE — OB Triage Note (Signed)
Discharged home. Left floor ambulatory by herself. Work note given. Martha Tucker

## 2023-07-22 ENCOUNTER — Encounter: Payer: Self-pay | Admitting: Obstetrics

## 2023-07-22 ENCOUNTER — Ambulatory Visit (INDEPENDENT_AMBULATORY_CARE_PROVIDER_SITE_OTHER): Payer: 59 | Admitting: Obstetrics

## 2023-07-22 VITALS — BP 120/70 | HR 96 | Wt 166.0 lb

## 2023-07-22 DIAGNOSIS — E669 Obesity, unspecified: Secondary | ICD-10-CM

## 2023-07-22 DIAGNOSIS — O99213 Obesity complicating pregnancy, third trimester: Secondary | ICD-10-CM

## 2023-07-22 DIAGNOSIS — Z23 Encounter for immunization: Secondary | ICD-10-CM | POA: Diagnosis not present

## 2023-07-22 DIAGNOSIS — Z3480 Encounter for supervision of other normal pregnancy, unspecified trimester: Secondary | ICD-10-CM

## 2023-07-22 DIAGNOSIS — A6009 Herpesviral infection of other urogenital tract: Secondary | ICD-10-CM

## 2023-07-22 DIAGNOSIS — O98813 Other maternal infectious and parasitic diseases complicating pregnancy, third trimester: Secondary | ICD-10-CM

## 2023-07-22 DIAGNOSIS — A6000 Herpesviral infection of urogenital system, unspecified: Secondary | ICD-10-CM

## 2023-07-22 DIAGNOSIS — Z3A34 34 weeks gestation of pregnancy: Secondary | ICD-10-CM

## 2023-07-22 DIAGNOSIS — O99333 Smoking (tobacco) complicating pregnancy, third trimester: Secondary | ICD-10-CM

## 2023-07-22 DIAGNOSIS — F17200 Nicotine dependence, unspecified, uncomplicated: Secondary | ICD-10-CM

## 2023-07-22 DIAGNOSIS — F17210 Nicotine dependence, cigarettes, uncomplicated: Secondary | ICD-10-CM

## 2023-07-22 NOTE — Patient Instructions (Signed)

## 2023-07-22 NOTE — Progress Notes (Signed)
    Return Prenatal Note   Subjective  32 y.o. O1H0865 at 32 y.o. O1H0865 at [redacted]w[redacted]d presents for this follow-up prenatal visit.   Patient without concerns today, just feeling "huge" and ready for delivery.   Patient reports: Movement: Present Contractions: Irritability Denies vaginal bleeding or leaking fluid. Objective  Flow sheet Vitals: Pulse Rate: 96 BP: 120/70 Fundal Height: 34 cm Fetal Heart Rate (bpm): 138 Total weight gain: 30 lb (13.6 kg)  General Appearance  No acute distress, well appearing, and well nourished Pulmonary   Normal work of breathing Neurologic   Alert and oriented to person, place, and time Psychiatric   Mood and affect within normal limits  Assessment/Plan   Plan  32 y.o. H8I6962 at [redacted]w[redacted]d by LMP=9wk [place] presents for follow-up OB visit. Reviewed prenatal record including previous visit note.  1. Supervision of other normal pregnancy, antepartum -Discussed next visit's GBS and rpt STI testing  2. Obesity affecting pregnancy in third trimester, unspecified obesity type -Limit weight gain, try for daily, physical activity  3. Smoker 1/2-3/4 ppd -Cessation encouraged  4. Genital herpes simplex type 2 -Start Valtrex ppx at 36wks  Return in about 2 weeks (around 08/05/2023) for ROB .   Future Appointments  Date Time Provider Department Center  08/05/2023  8:55 AM Free, Lindalou Hose, CNM [facility] None  08/13/2023 10:15 AM Mariane Masters, PT [facility] None   For next visit:  ROB with GBS screening     Julieanne Manson, DO [facility]

## 2023-08-05 ENCOUNTER — Other Ambulatory Visit: Payer: Self-pay

## 2023-08-05 ENCOUNTER — Ambulatory Visit (INDEPENDENT_AMBULATORY_CARE_PROVIDER_SITE_OTHER): Payer: 59

## 2023-08-05 ENCOUNTER — Other Ambulatory Visit (HOSPITAL_COMMUNITY)
Admission: RE | Admit: 2023-08-05 | Discharge: 2023-08-05 | Disposition: A | Discharge status: Home / Self Care | Payer: 59 | Source: Ambulatory Visit

## 2023-08-05 VITALS — BP 119/78 | HR 93 | Wt 166.0 lb

## 2023-08-05 DIAGNOSIS — Z3480 Encounter for supervision of other normal pregnancy, unspecified trimester: Secondary | ICD-10-CM

## 2023-08-05 DIAGNOSIS — Z113 Encounter for screening for infections with a predominantly sexual mode of transmission: Secondary | ICD-10-CM | POA: Diagnosis not present

## 2023-08-05 DIAGNOSIS — Z3685 Encounter for antenatal screening for Streptococcus B: Secondary | ICD-10-CM | POA: Diagnosis not present

## 2023-08-05 DIAGNOSIS — A6000 Herpesviral infection of urogenital system, unspecified: Secondary | ICD-10-CM

## 2023-08-05 NOTE — Assessment & Plan Note (Signed)
-   GBS and GC/CT screening swabs self-collected today. - Provided with resources for encouraging timely labor. - Reviewed labor warning signs and expectations for birth. Instructed to call office or come to hospital with persistent headache, vision changes, regular contractions, leaking of fluid, decreased fetal movement or vaginal bleeding.

## 2023-08-05 NOTE — Progress Notes (Signed)
    Return Prenatal Note   Assessment/Plan   Plan  32 y.o. G3P2002 at [redacted]w[redacted]d presents for follow-up OB visit. Reviewed prenatal record including previous visit note.  Genital herpes simplex type 2 - Has started on Valtrex suppression.  Supervision of other normal pregnancy, antepartum - GBS and GC/CT screening swabs self-collected today. - Provided with resources for encouraging timely labor. - Reviewed labor warning signs and expectations for birth. Instructed to call office or come to hospital with persistent headache, vision changes, regular contractions, leaking of fluid, decreased fetal movement or vaginal bleeding.   Orders Placed This Encounter  Procedures   Strep Gp B NAA   Return in about 1 week (around 08/12/2023) for ROB.   Future Appointments  Date Time Provider Department Center  08/12/2023  8:55 AM Mirna Mires, CNM [facility] None  08/13/2023 10:15 AM Mariane Masters, PT [facility] None  08/19/2023  8:55 AM Dominic, Courtney Heys, CNM [facility] None  08/26/2023  8:55 AM Dominica Severin, CNM [facility] None    For next visit:  continue with routine prenatal care     Subjective   32 y.o. H8I6962 at [redacted]w[redacted]d presents for this follow-up prenatal visit.  Patient has no concerns. Patient reports: Movement: Present Contractions: Not present  Objective   Flow sheet Vitals: Pulse Rate: 93 BP: 119/78 Fundal Height: 36 cm Fetal Heart Rate (bpm): 125 Presentation: Vertex (confirmed with BSUS) Total weight gain: 30 lb (13.6 kg)  General Appearance  No acute distress, well appearing, and well nourished Pulmonary   Normal work of breathing Neurologic   Alert and oriented to person, place, and time Psychiatric   Mood and affect within normal limits  Lindalou Hose Easter Schinke, CNM  08/05/2411:15 PM

## 2023-08-05 NOTE — Assessment & Plan Note (Signed)
-   Has started on Valtrex suppression.

## 2023-08-06 LAB — CERVICOVAGINAL ANCILLARY ONLY
Chlamydia: NEGATIVE
Comment: NEGATIVE
Comment: NORMAL
Neisseria Gonorrhea: NEGATIVE

## 2023-08-07 LAB — STREP GP B NAA: Strep Gp B NAA: NEGATIVE

## 2023-08-12 ENCOUNTER — Ambulatory Visit (INDEPENDENT_AMBULATORY_CARE_PROVIDER_SITE_OTHER): Payer: 59 | Admitting: Obstetrics

## 2023-08-12 VITALS — BP 109/85 | HR 94 | Wt 165.4 lb

## 2023-08-12 DIAGNOSIS — Z3480 Encounter for supervision of other normal pregnancy, unspecified trimester: Secondary | ICD-10-CM

## 2023-08-12 DIAGNOSIS — Z3A37 37 weeks gestation of pregnancy: Secondary | ICD-10-CM

## 2023-08-12 NOTE — Progress Notes (Signed)
Routine Prenatal Care Visit  Subjective  Martha Tucker is a 32 y.o. G3P2002 at [redacted]w[redacted]d being seen today for ongoing prenatal care.  She is currently monitored for the following issues for this low-risk pregnancy and has Genital herpes simplex type 2; Obesity affecting pregnancy in third trimester; Smoker 1/2-3/4 ppd; Supervision of other normal pregnancy, antepartum; and Vaginal discharge during pregnancy on their problem list.  ----------------------------------------------------------------------------------- Patient reports no complaints.  Her baby continues to move well. She denies contractions, LOF or vaginal bleeding. Contractions: Irritability. Vag. Bleeding: None.  Movement: Present. Leaking Fluid denies.  ----------------------------------------------------------------------------------- The following portions of the patient's history were reviewed and updated as appropriate: allergies, current medications, past family history, past medical history, past social history, past surgical history and problem list. Problem list updated.  Objective  Blood pressure 109/85, pulse 94, weight 165 lb 6.4 oz (75 kg), last menstrual period 11/22/2022. Pregravid weight 136 lb (61.7 kg) Total Weight Gain 29 lb 6.4 oz (13.3 kg) Urinalysis: Urine Protein    Urine Glucose    Fetal Status:     Movement: Present  Presentation: Vertex  General:  Alert, oriented and cooperative. Patient is in no acute distress.  Skin: Skin is warm and dry. No rash noted.   Cardiovascular: Normal heart rate noted  Respiratory: Normal respiratory effort, no problems with respiration noted  Abdomen: Soft, gravid, appropriate for gestational age. Pain/Pressure: Present     Pelvic:  Cervical exam deferred        Extremities: Normal range of motion.  Edema: None  Mental Status: Normal mood and affect. Normal behavior. Normal judgment and thought content.   Assessment   32 y.o. [id number] at [redacted]w[redacted]d by  08/29/2023, by Last  Menstrual Period presenting for routine prenatal visit  Plan   third Problems (from 01/18/23 to present)     Problem Noted Diagnosed Resolved   Supervision of other normal pregnancy, antepartum 01/18/2023 by Loran Senters, CMA  No   Overview Addendum 07/08/2023  8:39 AM by Loney Laurence, CMA   Clinical Staff Provider  Office Location  [facility] Dating  L and 9wk [place]  Language  English Anatomy US  DOne  Flu Vaccine  06/10/2023 Genetic Screen  NIPS: Negative/Female (02/08/23)  TDaP vaccine   06/10/2023 Hgb A1C or  GTT Early : Third trimester : elevated; passed 3 hr test  Covid One dose   LAB RESULTS   Rhogam  O/Positive/-- (06/14 0931)  Blood Type O/Positive/-- (06/14 0931)   Feeding Plan formula Antibody Negative (06/14 0931)  Contraception nexplanon Rubella 1.50 (06/14 0931)  Circumcision yes RPR Non Reactive (06/14 0931)   Pediatrician  [facility] HBsAg Negative (06/14 0931)   Support Person Weston Brass HIV Non Reactive (06/14 0931)  Prenatal Classes Yes if virtual Varicella Immune    GBS  (For PCN allergy, check sensitivities)   BTL Consent  Hep C Non Reactive (06/14 0931)   VBAC Consent  Pap No results found for: "DIAGPAP"    Hgb Electro      CF      SMA                    Term labor symptoms and general obstetric precautions including but not limited to vaginal bleeding, contractions, leaking of fluid and fetal movement were reviewed in detail with the patient. Please refer to After Visit Summary for other counseling recommendations.  Her GBS is negative. She is down to 2 cigarettes per day- Praised for this  accomplishment She was planning on Nexplanon placement in the hospital, but leaned about Mirena today, and may opt for IUD. I have downloaded IUD info in her MyChart. Return in about 1 week (around 08/19/2023) for return OB.  Mirna Mires, CNM  08/12/2023 9:19 AM

## 2023-08-12 NOTE — Patient Instructions (Addendum)

## 2023-08-12 NOTE — Addendum Note (Signed)
Addended by: Burtis Junes on: 08/12/2023 09:27 AM   Modules accepted: Orders

## 2023-08-13 ENCOUNTER — Ambulatory Visit: Payer: 59 | Admitting: Physical Therapy

## 2023-08-19 ENCOUNTER — Encounter: Payer: Self-pay | Admitting: Licensed Practical Nurse

## 2023-08-19 ENCOUNTER — Ambulatory Visit (INDEPENDENT_AMBULATORY_CARE_PROVIDER_SITE_OTHER): Payer: 59 | Admitting: Licensed Practical Nurse

## 2023-08-19 VITALS — BP 128/85 | HR 86 | Wt 166.9 lb

## 2023-08-19 DIAGNOSIS — Z3480 Encounter for supervision of other normal pregnancy, unspecified trimester: Secondary | ICD-10-CM

## 2023-08-19 DIAGNOSIS — O163 Unspecified maternal hypertension, third trimester: Secondary | ICD-10-CM | POA: Diagnosis not present

## 2023-08-19 DIAGNOSIS — Z3A38 38 weeks gestation of pregnancy: Secondary | ICD-10-CM

## 2023-08-19 NOTE — Progress Notes (Signed)
Routine Prenatal Care Visit  Subjective  Martha Tucker is a 32 y.o. G3P2002 at [redacted]w[redacted]d being seen today for ongoing prenatal care.  She is currently monitored for the following issues for this low-risk pregnancy and has Genital herpes simplex type 2; Obesity affecting pregnancy in third trimester; Smoker 1/2-3/4 ppd; Supervision of other normal pregnancy, antepartum; and Vaginal discharge during pregnancy on their problem list.  ----------------------------------------------------------------------------------- Patient reports  B-H contractions and heartburn. .  Doing well.  -has family to watch her kids once in labor -her partner will be her labor support.  -Mood has been good.  -Initial BP elevated, denies HA, Visual disturbances, or RUQ pain. Reviewed GHTN versus preeclampsia. This is her first elevated reading and repeat was WNL. Aware IOL may be recommended if a hypertensive disorder develops.   Contractions: Irritability. Vag. Bleeding: None.  Movement: Present. Leaking Fluid denies.  ----------------------------------------------------------------------------------- The following portions of the patient's history were reviewed and updated as appropriate: allergies, current medications, past family history, past medical history, past social history, past surgical history and problem list. Problem list updated.  Objective  Blood pressure (!) 111/91, pulse 91, weight 166 lb 14.4 oz (75.7 kg), last menstrual period 11/22/2022. Pregravid weight 136 lb (61.7 kg) Total Weight Gain 30 lb 14.4 oz (14 kg) Urinalysis: Urine Protein    Urine Glucose    Fetal Status: Fetal Heart Rate (bpm): 135 Fundal Height: 39 cm Movement: Present  Presentation: Vertex  General:  Alert, oriented and cooperative. Patient is in no acute distress.  Skin: Skin is warm and dry. No rash noted.   Cardiovascular: Normal heart rate noted  Respiratory: Normal respiratory effort, no problems with respiration noted   Abdomen: Soft, gravid, appropriate for gestational age. Pain/Pressure: Present     Pelvic:  Cervical exam performed Dilation: Fingertip Effacement (%): Thick Station: -3  Extremities: Normal range of motion.  Edema: None  Mental Status: Normal mood and affect. Normal behavior. Normal judgment and thought content.   Assessment   32 y.o. [id number] at [redacted]w[redacted]d by  08/29/2023, by Last Menstrual Period presenting for routine prenatal visit  Plan   third Problems (from 01/18/23 to present)     Problem Noted Diagnosed Resolved   Supervision of other normal pregnancy, antepartum 01/18/2023 by Loran Senters, CMA  No   Overview Addendum 08/12/2023  9:24 AM by Mirna Mires, CNM   Clinical Staff Provider  Office Location  [facility] Dating  L and 9wk [place]  Language  English Anatomy US  DOne  Flu Vaccine  06/10/2023 Genetic Screen  NIPS: Negative/Female (02/08/23)  TDaP vaccine   06/10/2023 Hgb A1C or  GTT Early : Third trimester : elevated; passed 3 hr test  Covid One dose   LAB RESULTS   Rhogam  O/Positive/-- (06/14 0931)  Blood Type O/Positive/-- (06/14 0931)   Feeding Plan formula Antibody Negative (06/14 0931)  Contraception Nexplanon/IUD Rubella 1.50 (06/14 0931)  Circumcision yes RPR Non Reactive (06/14 0931)   Pediatrician  [facility] HBsAg Negative (06/14 0931)   Support Person Weston Brass HIV Non Reactive (06/14 0931)  Prenatal Classes Yes if virtual Varicella Immune    GBS  (For PCN allergy, check sensitivities)   BTL Consent  Hep C Non Reactive (06/14 0931)   VBAC Consent  Pap No results found for: "DIAGPAP"    Hgb Electro      CF      SMA  Term labor symptoms and general obstetric precautions including but not limited to vaginal bleeding, contractions, leaking of fluid and fetal movement were reviewed in detail with the patient. Please refer to After Visit Summary for other counseling recommendations.   Return in about 1 week (around 08/26/2023) for  ROB.  PIH labs sent stat  Jannifer Hick  [facility]  08/19/23  12:53 PM

## 2023-08-20 ENCOUNTER — Encounter: Payer: Self-pay | Admitting: Licensed Practical Nurse

## 2023-08-20 LAB — CBC WITH DIFFERENTIAL/PLATELET
Basophils Absolute: 0 10*3/uL (ref 0.0–0.2)
Basos: 0 %
EOS (ABSOLUTE): 0.1 10*3/uL (ref 0.0–0.4)
Eos: 1 %
Hematocrit: 36.2 % (ref 34.0–46.6)
Hemoglobin: 12.1 g/dL (ref 11.1–15.9)
Immature Grans (Abs): 0.1 10*3/uL (ref 0.0–0.1)
Immature Granulocytes: 1 %
Lymphocytes Absolute: 2.6 10*3/uL (ref 0.7–3.1)
Lymphs: 27 %
MCH: 27.5 pg (ref 26.6–33.0)
MCHC: 33.4 g/dL (ref 31.5–35.7)
MCV: 82 fL (ref 79–97)
Monocytes Absolute: 1.1 10*3/uL — ABNORMAL HIGH (ref 0.1–0.9)
Monocytes: 11 %
Neutrophils Absolute: 5.8 10*3/uL (ref 1.4–7.0)
Neutrophils: 60 %
Platelets: 197 10*3/uL (ref 150–450)
RBC: 4.4 x10E6/uL (ref 3.77–5.28)
RDW: 13.4 % (ref 11.7–15.4)
WBC: 9.7 10*3/uL (ref 3.4–10.8)

## 2023-08-20 LAB — COMPREHENSIVE METABOLIC PANEL
ALT: 9 [IU]/L (ref 0–32)
AST: 16 [IU]/L (ref 0–40)
Albumin: 3.6 g/dL — ABNORMAL LOW (ref 3.9–4.9)
Alkaline Phosphatase: 169 [IU]/L — ABNORMAL HIGH (ref 44–121)
BUN/Creatinine Ratio: 23 (ref 9–23)
BUN: 10 mg/dL (ref 6–20)
Bilirubin Total: <0.2 mg/dL (ref 0.0–1.2)
CO2: 19 mmol/L — ABNORMAL LOW (ref 20–29)
Calcium: 8.8 mg/dL (ref 8.7–10.2)
Chloride: 105 mmol/L (ref 96–106)
Creatinine, Ser: 0.44 mg/dL — ABNORMAL LOW (ref 0.57–1.00)
Globulin, Total: 2.7 g/dL (ref 1.5–4.5)
Glucose: 98 mg/dL (ref 70–99)
Potassium: 3.8 mmol/L (ref 3.5–5.2)
Sodium: 138 mmol/L (ref 134–144)
Total Protein: 6.3 g/dL (ref 6.0–8.5)
eGFR: 132 mL/min/{1.73_m2} (ref >59)

## 2023-08-20 LAB — PROTEIN / CREATININE RATIO, URINE
Creatinine, Urine: 104.7 mg/dL
Protein, Ur: 25.7 mg/dL
Protein/Creat Ratio: 245 mg/g{creat} — ABNORMAL HIGH (ref 0–200)

## 2023-08-26 ENCOUNTER — Encounter: Payer: Self-pay | Admitting: Certified Nurse Midwife

## 2023-08-26 ENCOUNTER — Ambulatory Visit (INDEPENDENT_AMBULATORY_CARE_PROVIDER_SITE_OTHER): Payer: 59 | Admitting: Certified Nurse Midwife

## 2023-08-26 VITALS — BP 113/78 | HR 90 | Wt 164.3 lb

## 2023-08-26 DIAGNOSIS — Z3A39 39 weeks gestation of pregnancy: Secondary | ICD-10-CM

## 2023-08-26 DIAGNOSIS — Z3480 Encounter for supervision of other normal pregnancy, unspecified trimester: Secondary | ICD-10-CM

## 2023-08-26 DIAGNOSIS — A6000 Herpesviral infection of urogenital system, unspecified: Secondary | ICD-10-CM

## 2023-08-26 LAB — POCT URINALYSIS DIPSTICK OB
Bilirubin, UA: NEGATIVE
Blood, UA: NEGATIVE
Glucose, UA: NEGATIVE
Ketones, UA: NEGATIVE
Leukocytes, UA: NEGATIVE
Nitrite, UA: NEGATIVE
POC,PROTEIN,UA: NEGATIVE
Spec Grav, UA: 1.02 (ref 1.010–1.025)
Urobilinogen, UA: 0.2 U/dL
pH, UA: 6 (ref 5.0–8.0)

## 2023-08-26 NOTE — Progress Notes (Signed)
    Return Prenatal Note   Subjective   32 y.o. G9F6213 at [redacted]w[redacted]d presents for this follow-up prenatal visit.  Patient feeling well, ready to meet baby! Would like a cervical exam and sweep today. Patient reports: Movement: Present Contractions: Irritability  Objective   Flow sheet Vitals: Pulse Rate: 90 BP: 113/78 Fundal Height: 39 cm Fetal Heart Rate (bpm): 140 Presentation: Vertex Dilation: 2.5 (tight 3 after sweep) Effacement (%): 60 Station: -2 Total weight gain: 28 lb 4.8 oz (12.8 kg)  General Appearance  No acute distress, well appearing, and well nourished Pulmonary   Normal work of breathing Neurologic   Alert and oriented to person, place, and time Psychiatric   Mood and affect within normal limits  Assessment/Plan   Plan  32 y.o. [id number] at [redacted]w[redacted]d presents for follow-up OB visit. Reviewed prenatal record including previous visit note.  Genital herpes simplex type 2 On suppression, no lesions on exam.  Supervision of other normal pregnancy, antepartum Reviewed labor warning signs and expectations for birth. Instructed to call office or come to hospital with persistent headache, vision changes, regular contractions, leaking of fluid, decreased fetal movement or vaginal bleeding.  Membranes swept today. NST with next visit if no labor prior.      No orders of the defined types were placed in this encounter.  Return in 1 week (on 09/02/2023) for ROB & NST.   No future appointments.  For next visit:  ROB with NST     Dominica Severin, CNM  12/30/249:21 AM

## 2023-08-26 NOTE — Patient Instructions (Signed)

## 2023-08-26 NOTE — Addendum Note (Signed)
Addended by: Sheliah Hatch on: 08/26/2023 09:48 AM   Modules accepted: Orders

## 2023-08-26 NOTE — Assessment & Plan Note (Addendum)
Reviewed labor warning signs and expectations for birth. Instructed to call office or come to hospital with persistent headache, vision changes, regular contractions, leaking of fluid, decreased fetal movement or vaginal bleeding.  Membranes swept today. NST with next visit if no labor prior.

## 2023-08-26 NOTE — Assessment & Plan Note (Addendum)
On suppression, no lesions on exam.

## 2023-08-28 NOTE — L&D Delivery Note (Signed)
 Delivery Note   Martha Tucker is a 33 y.o. G3P2002 at [redacted]w[redacted]d Estimated Date of Delivery: 08/29/23  PRE-OPERATIVE DIAGNOSIS:  1) [redacted]w[redacted]d pregnancy.  2) Hx HSV on suppression without lesions  POST-OPERATIVE DIAGNOSIS:  1) [redacted]w[redacted]d pregnancy s/p Vaginal, Spontaneous   Delivery Type: Vaginal, Spontaneous   Delivery Anesthesia: Epidural  Labor Complications: None    ESTIMATED BLOOD LOSS: 300 ml    FINDINGS:   1) female infant, Apgar scores of 7   at 1 minute and 9   at 5 minutes and a birthweight pending per protocol.     SPECIMENS:   PLACENTA:   Appearance: Intact   Removal: Spontaneous     Disposition:  discarded  CORD BLOOD: collected for typing  DISPOSITION:  Infant left in stable condition in the delivery room, with L&D personnel and mother,  NARRATIVE SUMMARY: Labor course:  Martha Tucker is a H6E7997 at [redacted]w[redacted]d who presented to Labor & Delivery for labor management. Her initial cervical exam was 5-6/70/-2. Labor proceeded with augmentation and she was found to be completely dilated at 0756. With excellent maternal pushing effort, she birthed a viable female infant at 30. There not a nuchal cord. The shoulders were birthed without difficulty. The infant was placed skin-to-skin with mother. The cord was doubly clamped and cut when pulsations ceased. The placenta delivered spontaneously and was noted to be intact with a 3VC. A perineal and vaginal examination was performed. Episiotomy/Lacerations: None . The patient tolerated this well. Mother and baby were left in stable condition.   Harlene LITTIE Cisco, CNM 08/31/2023 8:45 AM

## 2023-08-30 ENCOUNTER — Other Ambulatory Visit: Payer: Self-pay

## 2023-08-30 ENCOUNTER — Encounter: Payer: Self-pay | Admitting: Certified Nurse Midwife

## 2023-08-30 ENCOUNTER — Observation Stay (HOSPITAL_BASED_OUTPATIENT_CLINIC_OR_DEPARTMENT_OTHER)
Admission: EM | Admit: 2023-08-30 | Discharge: 2023-08-30 | Disposition: A | Discharge status: Home / Self Care | Payer: 59 | Source: Home / Self Care | Admitting: Certified Nurse Midwife

## 2023-08-30 ENCOUNTER — Encounter: Payer: Self-pay | Admitting: Obstetrics and Gynecology

## 2023-08-30 ENCOUNTER — Other Ambulatory Visit: Payer: Self-pay | Admitting: Certified Nurse Midwife

## 2023-08-30 ENCOUNTER — Inpatient Hospital Stay
Admission: EM | Admit: 2023-08-30 | Discharge: 2023-09-01 | DRG: 806 | Disposition: A | Discharge status: Home / Self Care | Payer: 59 | Attending: Licensed Practical Nurse | Admitting: Licensed Practical Nurse

## 2023-08-30 DIAGNOSIS — O99334 Smoking (tobacco) complicating childbirth: Secondary | ICD-10-CM | POA: Diagnosis present

## 2023-08-30 DIAGNOSIS — A6000 Herpesviral infection of urogenital system, unspecified: Secondary | ICD-10-CM | POA: Diagnosis present

## 2023-08-30 DIAGNOSIS — F17210 Nicotine dependence, cigarettes, uncomplicated: Secondary | ICD-10-CM | POA: Diagnosis present

## 2023-08-30 DIAGNOSIS — R109 Unspecified abdominal pain: Secondary | ICD-10-CM

## 2023-08-30 DIAGNOSIS — Z30017 Encounter for initial prescription of implantable subdermal contraceptive: Secondary | ICD-10-CM | POA: Diagnosis not present

## 2023-08-30 DIAGNOSIS — O9832 Other infections with a predominantly sexual mode of transmission complicating childbirth: Secondary | ICD-10-CM | POA: Diagnosis not present

## 2023-08-30 DIAGNOSIS — Z8249 Family history of ischemic heart disease and other diseases of the circulatory system: Secondary | ICD-10-CM

## 2023-08-30 DIAGNOSIS — O26893 Other specified pregnancy related conditions, third trimester: Secondary | ICD-10-CM

## 2023-08-30 DIAGNOSIS — Z051 Observation and evaluation of newborn for suspected infectious condition ruled out: Secondary | ICD-10-CM | POA: Diagnosis not present

## 2023-08-30 DIAGNOSIS — Z3480 Encounter for supervision of other normal pregnancy, unspecified trimester: Principal | ICD-10-CM

## 2023-08-30 DIAGNOSIS — O9882 Other maternal infectious and parasitic diseases complicating childbirth: Secondary | ICD-10-CM | POA: Diagnosis not present

## 2023-08-30 DIAGNOSIS — O480 Post-term pregnancy: Secondary | ICD-10-CM

## 2023-08-30 DIAGNOSIS — Z3046 Encounter for surveillance of implantable subdermal contraceptive: Secondary | ICD-10-CM | POA: Diagnosis not present

## 2023-08-30 DIAGNOSIS — Z23 Encounter for immunization: Secondary | ICD-10-CM | POA: Diagnosis not present

## 2023-08-30 DIAGNOSIS — Z3A40 40 weeks gestation of pregnancy: Secondary | ICD-10-CM

## 2023-08-30 DIAGNOSIS — O471 False labor at or after 37 completed weeks of gestation: Principal | ICD-10-CM | POA: Insufficient documentation

## 2023-08-30 DIAGNOSIS — O479 False labor, unspecified: Secondary | ICD-10-CM | POA: Diagnosis present

## 2023-08-30 DIAGNOSIS — B009 Herpesviral infection, unspecified: Secondary | ICD-10-CM | POA: Diagnosis not present

## 2023-08-30 MED ORDER — LIDOCAINE HCL (PF) 1 % IJ SOLN: 30.0000 mL | INTRAMUSCULAR | Status: DC | PRN

## 2023-08-30 MED ORDER — OXYTOCIN BOLUS FROM INFUSION
333.0000 mL | Freq: Once | INTRAVENOUS | Status: AC
  Administered 2023-08-31: 333 mL via INTRAVENOUS

## 2023-08-30 MED ORDER — TERBUTALINE SULFATE 1 MG/ML IJ SOLN: 0.2500 mg | Freq: Once | INTRAMUSCULAR | Status: DC | PRN

## 2023-08-30 MED ORDER — FENTANYL CITRATE (PF) 100 MCG/2ML IJ SOLN: 50.0000 ug | INTRAMUSCULAR | Status: DC | PRN

## 2023-08-30 MED ORDER — OXYTOCIN-SODIUM CHLORIDE 30-0.9 UT/500ML-% IV SOLN
2.5000 [IU]/h | INTRAVENOUS | Status: DC
Start: 2023-08-31 — End: 2023-09-01

## 2023-08-30 MED ORDER — LACTATED RINGERS IV SOLN
INTRAVENOUS | Status: DC
Start: 2023-08-31 — End: 2023-09-01

## 2023-08-30 MED ORDER — ACETAMINOPHEN 325 MG PO TABS: 650.0000 mg | ORAL_TABLET | ORAL | Status: DC | PRN

## 2023-08-30 MED ORDER — ONDANSETRON HCL 4 MG/2ML IJ SOLN
4.0000 mg | Freq: Four times a day (QID) | INTRAMUSCULAR | Status: DC | PRN
Start: 2023-08-30 — End: 2023-08-31

## 2023-08-30 MED ORDER — LACTATED RINGERS IV SOLN
500.0000 mL | INTRAVENOUS | Status: DC | PRN
  Administered 2023-08-31: 500 mL via INTRAVENOUS

## 2023-08-30 MED ORDER — SOD CITRATE-CITRIC ACID 500-334 MG/5ML PO SOLN: 30.0000 mL | ORAL | Status: DC | PRN

## 2023-08-30 MED ORDER — OXYTOCIN-SODIUM CHLORIDE 30-0.9 UT/500ML-% IV SOLN: 1.0000 m[IU]/min | INTRAVENOUS | Status: DC

## 2023-08-30 NOTE — OB Triage Note (Signed)
 Pt reports to labor and delivery with complaints of intermittent contractions. She states that they range from every 5 minutes to every 10 minutes. She denies leaking of fluid and vaginal bleeding. States positive fetal movement. EMF and toco applied and assessing.

## 2023-08-30 NOTE — H&P (Signed)
 [facility] Labor & Delivery  History and Physical   HPI   Chief Complaint: contractions  Martha Tucker is a 33 y.o. G3P2002 at [redacted]w[redacted]d who presents for contractions. She was seen in triage and after 2h had no cervical change. After discharge contractions increased in frequency & intensity. Denies loss of fluid or vaginal bleeding. Endorses fetal movement.     Pregnancy Complications Patient Active Problem List   Diagnosis Date Noted   Uterine contractions 08/30/2023   Post term pregnancy, antepartum condition or complication 08/30/2023   Vaginal discharge during pregnancy 07/20/2023   Supervision of other normal pregnancy, antepartum 01/18/2023   Obesity affecting pregnancy in third trimester 11/23/2020   Smoker 1/2-3/4 ppd 11/23/2020   Genital herpes simplex type 2 06/13/2017    Review of Systems A twelve point review of systems was negative except as stated in HPI.   HISTORY   Medications Medications Prior to Admission  Medication Sig Dispense Refill Last Dose/Taking   Prenatal Vit-Fe Fumarate-FA (PRENATAL MULTIVITAMIN) TABS tablet Take 1 tablet by mouth daily at 12 noon.      valACYclovir  (VALTREX ) 500 MG tablet Take 1 tablet (500 mg total) by mouth 2 (two) times daily. 180 tablet 1     Allergies has no known allergies.   OB History OB History  Gravida Para Term Preterm AB Living  3 2 2  0 0 2  SAB IAB Ectopic Multiple Live Births  0 0 0 0 2    # Outcome Date GA Lbr Len/2nd Weight Sex Type Anes PTL Lv  3 Current           2 Term 01/16/18 [redacted]w[redacted]d  3884 g M Vag-Spont   LIV     Birth Comments: [facility]  1 Term 03/17/12 [redacted]w[redacted]d  3317 g F Vag-Spont   LIV     Birth Comments: [facility]    Past Medical History Past Medical History:  Diagnosis Date   Genital herpes    History of abnormal cervical Pap smear     Past Surgical History Past Surgical History:  Procedure Laterality Date   TONSILLECTOMY      Social History  reports that she has been  smoking cigarettes. She has a 3.8 pack-year smoking history. She has never used smokeless tobacco. She reports that she does not currently use alcohol. She reports that she does not currently use drugs after having used the following drugs: Marijuana.   Family History family history includes Arthritis in her father and mother; CVA in her paternal grandmother; Cancer (age of onset: 72) in her paternal grandfather; Healthy in her brother, brother, brother, brother, brother, maternal grandfather, maternal grandmother, and sister; Hypertension in her mother; Kidney disease in her father.   PHYSICAL EXAM   There were no vitals filed for this visit.  Constitutional: No acute distress, well appearing, and well nourished. Neurologic: She is alert and conversational.  Psychiatric: She has a normal mood and affect.  Musculoskeletal: Normal gait, grossly normal range of motion Cardiovascular: Normal rate.   Pulmonary/Chest: Normal work of breathing.  Gastrointestinal/Abdominal: Soft. Gravid. There is no tenderness.  Skin: Skin is warm and dry. No rash noted.  Genitourinary: Normal external female genitalia.  SVE:   Dilation: 5.5 Effacement (%): 70 Cervical Position: Middle Station: -2 Presentation: Vertex Exam by:: Jayne, CNM No lesions on exam  NST Interpretation Indication: contractions Baseline: 140 bpm Variability: moderate Accelerations: absent Decelerations: absent Contractions: regular, every 5 minutes Time noted:  See OBIX Impression: equivocal Authenticated  by: Harlene LITTIE Cisco    ASSESSMENT AND PLAN   Martha Tucker is a 33 y.o. [id number] at [redacted]w[redacted]d with EDD: 08/29/2023, by Last Menstrual Period admitted for labor management.  Expectant management  Fetal Status: - cephalic presentation by SVE & Leopolds - EFW: 7.5lb by Leopolds - CEFM - FHT currently cat I  HSV -on Valtrex  -no lesions, no prodromal symptoms  Elevated 1h with normal  3h  Labs/Immunizations: TDAP: 06/10/23 Flu: 06/10/23 RSV: 07/08/23 Rubella: Immune Varicella immune HIV: NR Hep B: negative Hep C: NR RPR: NR GBS: negative  Lab Results  Component Value Date   VZVIGG 2,914 02/08/2023   HIV Non Reactive 06/10/2023     Postpartum Plan: - Feeding: Breast Milk - Contraception: plans  nexplanon  vs mirena - Prenatal Care Provider: AOB  Attending Dr. Janit was immediately available for the care of the patient.     Future Appointments  Date Time Provider Department Center  09/03/2023  8:45 AM AOB-NST ROOM [facility] None  09/03/2023  9:35 AM Cisco Harlene LITTIE, CNM [facility] None

## 2023-08-30 NOTE — OB Triage Note (Signed)

## 2023-08-30 NOTE — Discharge Summary (Signed)
 LABOR & DELIVERY OB TRIAGE NOTE  SUBJECTIVE  HPI Martha Tucker is a 33 y.o. G3P2002 at [redacted]w[redacted]d who presents to Labor & Delivery for contractions. Contractions have been irregular since 6am, some stronger than others. Endorses fetal movement, denies loss of fluid or vaginal bleeding.  OB History     Gravida  3   Para  2   Term  2   Preterm  0   AB  0   Living  2      SAB  0   IAB  0   Ectopic  0   Multiple      Live Births  2            OBJECTIVE  BP 114/77 (BP Location: Left Arm)   Pulse 95   Temp 99.5 F (37.5 C) (Oral)   Resp 16   Wt 74.5 kg   LMP 11/22/2022 (Exact Date)   BMI 31.03 kg/m   General: A&Ox4 NAD Heart: regular rate Lungs: normal work of breathing Abdomen: soft, nontender gravid Cervical exam: Dilation: 4 Effacement (%): 60 Cervical Position: Middle Station: -2 Presentation: Vertex Exam by:: T Hunter, Rn  SVE unchanged on serial exams by same examiner  NST I reviewed the NST and it was reactive.  Baseline: 140 Variability: moderate Accelerations: present Decelerations:none Toco: q6-20, mild, 30-60s, soft resting tone Category I  ASSESSMENT Impression  1) Pregnancy at G3P2002, [redacted]w[redacted]d, Estimated Date of Delivery: 08/29/23 2) Reassuring maternal/fetal status, no evidence of active labor   PLAN Discharge home with term labor precautions. Encouraged [employer] company. Labor & call signs, fetal movement reviewed. Maintain next scheduled visit 1/7. PDIOL at 40+6 scheduled 1/8 8am. Harlene LITTIE Cisco, CNM 08/30/23  6:14 PM

## 2023-08-31 ENCOUNTER — Inpatient Hospital Stay: Payer: 59 | Admitting: Anesthesiology

## 2023-08-31 ENCOUNTER — Encounter: Payer: Self-pay | Admitting: Obstetrics and Gynecology

## 2023-08-31 DIAGNOSIS — B009 Herpesviral infection, unspecified: Secondary | ICD-10-CM

## 2023-08-31 DIAGNOSIS — O480 Post-term pregnancy: Principal | ICD-10-CM

## 2023-08-31 DIAGNOSIS — O9882 Other maternal infectious and parasitic diseases complicating childbirth: Secondary | ICD-10-CM

## 2023-08-31 DIAGNOSIS — Z3A40 40 weeks gestation of pregnancy: Secondary | ICD-10-CM | POA: Diagnosis not present

## 2023-08-31 LAB — URINE DRUG SCREEN, QUALITATIVE (ARMC ONLY)
Amphetamines, Ur Screen: NOT DETECTED
Barbiturates, Ur Screen: NOT DETECTED
Benzodiazepine, Ur Scrn: NOT DETECTED
Cannabinoid 50 Ng, Ur ~~LOC~~: POSITIVE — AB
Cocaine Metabolite,Ur ~~LOC~~: NOT DETECTED
MDMA (Ecstasy)Ur Screen: NOT DETECTED
Methadone Scn, Ur: NOT DETECTED
Opiate, Ur Screen: NOT DETECTED
Phencyclidine (PCP) Ur S: NOT DETECTED
Tricyclic, Ur Screen: NOT DETECTED

## 2023-08-31 LAB — TYPE AND SCREEN
ABO/RH(D): O POS
Antibody Screen: NEGATIVE

## 2023-08-31 LAB — ABO/RH: ABO/RH(D): O POS

## 2023-08-31 LAB — CBC
HCT: 37.8 % (ref 36.0–46.0)
Hemoglobin: 12.6 g/dL (ref 12.0–15.0)
MCH: 27.5 pg (ref 26.0–34.0)
MCHC: 33.3 g/dL (ref 30.0–36.0)
MCV: 82.5 fL (ref 80.0–100.0)
Platelets: 218 10*3/uL (ref 150–400)
RBC: 4.58 MIL/uL (ref 3.87–5.11)
RDW: 14.8 % (ref 11.5–15.5)
WBC: 9.9 10*3/uL (ref 4.0–10.5)
nRBC: 0 % (ref 0.0–0.2)

## 2023-08-31 LAB — RPR: RPR Ser Ql: NONREACTIVE

## 2023-08-31 MED ORDER — ONDANSETRON HCL 4 MG PO TABS: 4.0000 mg | ORAL_TABLET | ORAL | Status: DC | PRN

## 2023-08-31 MED ORDER — OXYTOCIN-SODIUM CHLORIDE 30-0.9 UT/500ML-% IV SOLN
1.0000 m[IU]/min | INTRAVENOUS | Status: DC
  Administered 2023-08-31: 2 m[IU]/min via INTRAVENOUS

## 2023-08-31 MED ORDER — BENZOCAINE-MENTHOL 20-0.5 % EX AERO
1.0000 | INHALATION_SPRAY | CUTANEOUS | Status: DC | PRN
  Administered 2023-09-01: 1 via TOPICAL
  Filled 2023-08-31: qty 56

## 2023-08-31 MED ORDER — ONDANSETRON HCL 4 MG/2ML IJ SOLN: 4.0000 mg | INTRAMUSCULAR | Status: DC | PRN

## 2023-08-31 MED ORDER — ZOLPIDEM TARTRATE 5 MG PO TABS
5.0000 mg | ORAL_TABLET | Freq: Every evening | ORAL | Status: DC | PRN
Start: 2023-08-31 — End: 2023-09-01

## 2023-08-31 MED ORDER — EPHEDRINE 5 MG/ML INJ
10.0000 mg | INTRAVENOUS | Status: DC | PRN
Start: 2023-08-31 — End: 2023-08-31

## 2023-08-31 MED ORDER — LACTATED RINGERS IV SOLN: 500.0000 mL | Freq: Once | INTRAVENOUS | Status: DC

## 2023-08-31 MED ORDER — LIDOCAINE HCL (PF) 1 % IJ SOLN
INTRAMUSCULAR | Status: DC | PRN
  Administered 2023-08-31: 3 mL via SUBCUTANEOUS

## 2023-08-31 MED ORDER — PRENATAL MULTIVITAMIN CH
1.0000 | ORAL_TABLET | Freq: Every day | ORAL | Status: DC
  Administered 2023-08-31 – 2023-09-01 (×2): 1 via ORAL
  Filled 2023-08-31: qty 1

## 2023-08-31 MED ORDER — SODIUM CHLORIDE 0.9 % IV SOLN
INTRAVENOUS | Status: DC | PRN
  Administered 2023-08-31: 7 mL via EPIDURAL

## 2023-08-31 MED ORDER — PHENYLEPHRINE 80 MCG/ML (10ML) SYRINGE FOR IV PUSH (FOR BLOOD PRESSURE SUPPORT): 80.0000 ug | PREFILLED_SYRINGE | INTRAVENOUS | Status: DC | PRN

## 2023-08-31 MED ORDER — TERBUTALINE SULFATE 1 MG/ML IJ SOLN: 0.2500 mg | Freq: Once | INTRAMUSCULAR | Status: DC | PRN

## 2023-08-31 MED ORDER — DIPHENHYDRAMINE HCL 25 MG PO CAPS
25.0000 mg | ORAL_CAPSULE | Freq: Four times a day (QID) | ORAL | Status: DC | PRN
Start: 2023-08-31 — End: 2023-09-01

## 2023-08-31 MED ORDER — SIMETHICONE 80 MG PO CHEW
80.0000 mg | CHEWABLE_TABLET | ORAL | Status: DC | PRN
Start: 2023-08-31 — End: 2023-09-01

## 2023-08-31 MED ORDER — SENNOSIDES-DOCUSATE SODIUM 8.6-50 MG PO TABS
2.0000 | ORAL_TABLET | Freq: Every day | ORAL | Status: DC
Start: 2023-09-01 — End: 2023-09-01
  Administered 2023-09-01: 2 via ORAL
  Filled 2023-08-31: qty 2

## 2023-08-31 MED ORDER — DIBUCAINE (PERIANAL) 1 % EX OINT: 1.0000 | TOPICAL_OINTMENT | CUTANEOUS | Status: DC | PRN

## 2023-08-31 MED ORDER — DIPHENHYDRAMINE HCL 50 MG/ML IJ SOLN: 12.5000 mg | INTRAMUSCULAR | Status: DC | PRN

## 2023-08-31 MED ORDER — WITCH HAZEL-GLYCERIN EX PADS
1.0000 | MEDICATED_PAD | CUTANEOUS | Status: DC | PRN
  Administered 2023-09-01: 1 via TOPICAL
  Filled 2023-08-31: qty 100

## 2023-08-31 MED ORDER — ACETAMINOPHEN 325 MG PO TABS
650.0000 mg | ORAL_TABLET | ORAL | Status: DC | PRN
Start: 2023-08-31 — End: 2023-09-01
  Administered 2023-08-31: 650 mg via ORAL
  Filled 2023-08-31: qty 2

## 2023-08-31 MED ORDER — IBUPROFEN 600 MG PO TABS
600.0000 mg | ORAL_TABLET | Freq: Four times a day (QID) | ORAL | Status: DC
Start: 2023-08-31 — End: 2023-09-01
  Administered 2023-08-31 – 2023-09-01 (×4): 600 mg via ORAL
  Filled 2023-08-31 (×4): qty 1

## 2023-08-31 MED ORDER — EPHEDRINE 5 MG/ML INJ: 10.0000 mg | INTRAVENOUS | Status: DC | PRN

## 2023-08-31 MED ORDER — COCONUT OIL OIL: 1.0000 | TOPICAL_OIL | Status: DC | PRN

## 2023-08-31 MED ORDER — FENTANYL-BUPIVACAINE-NACL 0.5-0.125-0.9 MG/250ML-% EP SOLN
10.0000 mL/h | EPIDURAL | Status: DC | PRN
  Administered 2023-08-31: 10 mL/h via EPIDURAL
  Filled 2023-08-31: qty 250

## 2023-08-31 MED ORDER — LIDOCAINE-EPINEPHRINE (PF) 1.5 %-1:200000 IJ SOLN
INTRAMUSCULAR | Status: DC | PRN
  Administered 2023-08-31: 3 mL via EPIDURAL

## 2023-08-31 MED ORDER — OXYTOCIN-SODIUM CHLORIDE 30-0.9 UT/500ML-% IV SOLN
INTRAVENOUS | Status: AC
  Filled 2023-08-31: qty 500

## 2023-08-31 NOTE — Anesthesia Preprocedure Evaluation (Signed)
 Anesthesia Evaluation  Patient identified by MRN, date of birth, ID band Patient awake    Reviewed: Allergy & Precautions, NPO status , Patient's Chart, lab work & pertinent test results  History of Anesthesia Complications Negative for: history of anesthetic complications  Airway Mallampati: II  TM Distance: >3 FB Neck ROM: Full    Dental no notable dental hx. (+) Teeth Intact   Pulmonary neg sleep apnea, neg COPD, Current Smoker and Patient abstained from smoking.   Pulmonary exam normal breath sounds clear to auscultation       Cardiovascular Exercise Tolerance: Good METS(-) hypertension(-) CAD and (-) Past MI negative cardio ROS (-) dysrhythmias  Rhythm:Regular Rate:Normal - Systolic murmurs    Neuro/Psych negative neurological ROS  negative psych ROS   GI/Hepatic ,neg GERD  ,,(+)     (-) substance abuse    Endo/Other  neg diabetes    Renal/GU negative Renal ROS     Musculoskeletal   Abdominal   Peds  Hematology   Anesthesia Other Findings Past Medical History: No date: Genital herpes No date: History of abnormal cervical Pap smear  Reproductive/Obstetrics (+) Pregnancy                             Anesthesia Physical Anesthesia Plan  ASA: 2  Anesthesia Plan: Epidural   Post-op Pain Management:    Induction:   PONV Risk Score and Plan: 1 and Treatment may vary due to age or medical condition and Ondansetron   Airway Management Planned: Natural Airway  Additional Equipment:   Intra-op Plan:   Post-operative Plan:   Informed Consent: I have reviewed the patients History and Physical, chart, labs and discussed the procedure including the risks, benefits and alternatives for the proposed anesthesia with the patient or authorized representative who has indicated his/her understanding and acceptance.       Plan Discussed with: Surgeon  Anesthesia Plan Comments:  (Discussed R/B/A of neuraxial anesthesia technique with patient: - rare risks of spinal/epidural hematoma, nerve damage, infection - Risk of PDPH - Risk of itching - Risk of nausea and vomiting - Risk of poor block necessitating replacement of epidural. - Risk of allergic reactions. Patient voiced understanding.)       Anesthesia Quick Evaluation

## 2023-08-31 NOTE — Discharge Summary (Signed)
 Postpartum Discharge Summary  Date of Service updated 09/01/23      Patient Name: Martha Tucker DOB: 12-11-1990 MRN: [id number] Date of admission: 08/30/2023 Delivery date:08/31/2023 Delivering provider: JAYNE HARLENE CROME Date of discharge: 09/01/2023  Admitting diagnosis: Post term pregnancy, antepartum condition or complication [O48.0] Intrauterine pregnancy: [redacted]w[redacted]d     Secondary diagnosis:  Principal Problem:   Post term pregnancy, antepartum condition or complication  Additional problems: None    Discharge diagnosis: Term Pregnancy Delivered                                              Post partum procedures: Nexplanon  insertion Augmentation: Pitocin  Complications: None  Hospital course: Onset of Labor With Vaginal Delivery      33 y.o. yo G3P3003 at [redacted]w[redacted]d was admitted in Active Labor on 08/30/2023. Labor course was complicated by protracted active phase.  Membrane Rupture Time/Date: 7:56 AM,08/31/2023  Delivery Method:Vaginal, Spontaneous Operative Delivery:N/A Episiotomy: None Lacerations:  None Patient had an uncomplicated postpartum course.  She is ambulating, tolerating a regular diet, passing flatus, and urinating well. Patient is discharged home in stable condition on 09/01/23.  Newborn Data: Birth date:08/31/2023 Birth time:8:06 AM Gender:Female Living status:Living Apgars:7 ,9  Weight:3620 g  Magnesium Sulfate received: No BMZ received: No Rhophylac:N/A MMR:No T-DaP:Given prenatally Flu: No RSV Vaccine received: Yes Transfusion:No Immunizations administered: Immunization History  Administered Date(s) Administered   Hepatitis A 06/07/2009   Hepatitis B 09/05/1994, 03/05/1995, 09/10/1995   Hpv-Unspecified 06/07/2009, 06/15/2010   Influenza, Seasonal, Injecte, Preservative Fre 06/10/2023, 07/22/2023   Moderna Covid-19 Vaccine Bivalent Booster 41yrs & up 12/06/2021   Rsv, Bivalent, Protein Subunit Rsvpref,pf [place]) 07/08/2023   Tdap 10/31/2017,  06/10/2023   Varicella 01/17/2018    Physical exam  Vitals:   08/31/23 1618 08/31/23 1950 08/31/23 2341 09/01/23 0734  BP: 108/76 119/85 113/78 103/83  Pulse: 84 87 82 66  Resp: 20 18 18 18   Temp: 98.5 F (36.9 C) 98.3 F (36.8 C) 97.7 F (36.5 C) (!) 97.5 F (36.4 C)  TempSrc: Oral Oral  Oral  SpO2: 100% 100% 100% 97%  Weight:      Height:       General: alert, cooperative, and no distress Lochia: appropriate Uterine Fundus: firm Incision: N/A DVT Evaluation: No evidence of DVT seen on physical exam. Labs: Lab Results  Component Value Date   WBC 11.2 (H) 09/01/2023   HGB 10.9 (L) 09/01/2023   HCT 33.4 (L) 09/01/2023   MCV 85.0 09/01/2023   PLT 188 09/01/2023      Latest Ref Rng & Units 08/19/2023    9:30 AM  CMP  Glucose 70 - 99 mg/dL 98   BUN 6 - 20 mg/dL 10   Creatinine 9.42 - 1.00 mg/dL 9.55   Sodium 865 - 855 mmol/L 138   Potassium 3.5 - 5.2 mmol/L 3.8   Chloride 96 - 106 mmol/L 105   CO2 20 - 29 mmol/L 19   Calcium 8.7 - 10.2 mg/dL 8.8   Total Protein 6.0 - 8.5 g/dL 6.3   Total Bilirubin 0.0 - 1.2 mg/dL <9.7   Alkaline Phos 44 - 121 IU/L 169   AST 0 - 40 IU/L 16   ALT 0 - 32 IU/L 9    Edinburgh Score:    08/31/2023   11:17 PM  Edinburgh Postnatal Depression Scale Screening Tool  I have been able to laugh and see the funny side of things. 0  I have looked forward with enjoyment to things. 0  I have blamed myself unnecessarily when things went wrong. 0  I have been anxious or worried for no good reason. 0  I have felt scared or panicky for no good reason. 0  Things have been getting on top of me. 0  I have been so unhappy that I have had difficulty sleeping. 0  I have felt sad or miserable. 0  I have been so unhappy that I have been crying. 0  The thought of harming myself has occurred to me. 0  Edinburgh Postnatal Depression Scale Total 0      After visit meds:  Allergies as of 09/01/2023   No Known Allergies      Medication List      TAKE these medications    prenatal multivitamin Tabs tablet Take 1 tablet by mouth daily at 12 noon.   valACYclovir  500 MG tablet Commonly known as: Valtrex  Take 1 tablet (500 mg total) by mouth 2 (two) times daily.         Discharge home in stable condition Infant Feeding: Bottle Infant Disposition:home with mother Discharge instruction: per After Visit Summary and Postpartum booklet. Activity: Advance as tolerated. Pelvic rest for 6 weeks.  Diet: routine diet Anticipated Birth Control: Nexplanon  placed prior to discharge Postpartum Appointment: video visit in 2 weeks, office visit in 6 weeks  Future Appointments: Future Appointments  Date Time Provider Department Center  09/03/2023  8:45 AM AOB-NST ROOM [facility] None  09/03/2023  9:35 AM Jayne Harlene CROME, CNM [facility] None   Follow up Visit:  Follow-up Information     [facility] at [facility] Follow up in 2 week(s).   Specialty: Obstetrics and Gynecology Why: virtual postpartum mood check Contact information: [address] [place]  [address] [phone]        [facility] Follow up in 6 week(s).   Specialty: Obstetrics and Gynecology Why: postpartum visit Contact information: [address] [place]  [address] [phone]                    09/01/2023 Eleanor CHRISTELLA Canny, CNM

## 2023-08-31 NOTE — Anesthesia Procedure Notes (Signed)
 Epidural Patient location during procedure: OB  Staffing Anesthesiologist: Corinda Gubler, MD Performed: anesthesiologist   Preanesthetic Checklist Completed: patient identified, IV checked, site marked, risks and benefits discussed, surgical consent, monitors and equipment checked, pre-op evaluation and timeout performed  Epidural Patient position: sitting Prep: ChloraPrep Patient monitoring: heart rate, continuous pulse ox and blood pressure Approach: midline Location: L4-L5 Injection technique: LOR saline  Needle:  Needle type: Tuohy  Needle gauge: 17 G Needle length: 9 cm Needle insertion depth: 6 cm Catheter type: closed end flexible Catheter size: 19 Gauge Catheter at skin depth: 11 cm Test dose: negative and 1.5% lidocaine with Epi 1:200 K  Assessment Sensory level: T10 Events: blood not aspirated, no cerebrospinal fluid, injection not painful, no injection resistance, no paresthesia and negative IV test  Additional Notes First/one attempt Pt. Evaluated and documentation done after procedure finished. Patient identified. Risks/Benefits/Options discussed with patient including but not limited to bleeding, infection, nerve damage, paralysis, failed block, incomplete pain control, headache, blood pressure changes, nausea, vomiting, reactions to medication both or allergic, itching and postpartum back pain. Confirmed with bedside nurse the patient's most recent platelet count. Confirmed with patient that they are not currently taking any anticoagulation, have any bleeding history or any family history of bleeding disorders. Patient expressed understanding and wished to proceed. All questions were answered. Sterile technique was used throughout the entire procedure. Please see nursing notes for vital signs. Test dose was given through epidural catheter and negative prior to continuing to dose epidural or start infusion. Warning signs of high block given to the patient including  shortness of breath, tingling/numbness in hands, complete motor block, or any concerning symptoms with instructions to call for help. Patient was given instructions on fall risk and not to get out of bed. All questions and concerns addressed with instructions to call with any issues or inadequate analgesia.     Patient tolerated the insertion well without immediate complications.  Reason for block: procedure for painReason for block:procedure for pain

## 2023-09-01 ENCOUNTER — Encounter: Payer: Self-pay | Admitting: Obstetrics and Gynecology

## 2023-09-01 DIAGNOSIS — Z3046 Encounter for surveillance of implantable subdermal contraceptive: Secondary | ICD-10-CM

## 2023-09-01 LAB — CBC
HCT: 33.4 % — ABNORMAL LOW (ref 36.0–46.0)
Hemoglobin: 10.9 g/dL — ABNORMAL LOW (ref 12.0–15.0)
MCH: 27.7 pg (ref 26.0–34.0)
MCHC: 32.6 g/dL (ref 30.0–36.0)
MCV: 85 fL (ref 80.0–100.0)
Platelets: 188 10*3/uL (ref 150–400)
RBC: 3.93 MIL/uL (ref 3.87–5.11)
RDW: 14.8 % (ref 11.5–15.5)
WBC: 11.2 10*3/uL — ABNORMAL HIGH (ref 4.0–10.5)
nRBC: 0 % (ref 0.0–0.2)

## 2023-09-01 MED ORDER — LIDOCAINE HCL (PF) 1 % IJ SOLN
2.0000 mL | Freq: Once | INTRAMUSCULAR | Status: AC
  Administered 2023-09-01: 2 mL via INTRADERMAL
  Filled 2023-09-01: qty 2

## 2023-09-01 MED ORDER — ETONOGESTREL 68 MG ~~LOC~~ IMPL
68.0000 mg | DRUG_IMPLANT | Freq: Once | SUBCUTANEOUS | Status: AC
  Administered 2023-09-01: 68 mg via SUBCUTANEOUS
  Filled 2023-09-01 (×2): qty 1

## 2023-09-01 NOTE — Progress Notes (Signed)
Discharge instructions given and reviewed with pt and family. Pt educated on follow up care, appointments and when to notify provider, questions invited and answered. ID bands matched with infant.  Pt and family noted understanding  to teaching/education.Pt escorted off unit by volunteer in wheelchair with infant safely in arms. Infant secured in car seat by family.  

## 2023-09-01 NOTE — Final Progress Note (Signed)
 Post Partum Day 1 Subjective: Martha Tucker is feeling well overall. She is ambulating, voiding, and tolerating POs without difficulty. Her pain is well-controlled and her bleeding is WNL. Her mood is stable. She is bottle feeding.She desires reversible long-term contraception. We have thoroughly reviewed the risks, benefits, and alternatives, and she has elected to proceed with Nexplanon  insertion.     Objective: Blood pressure 103/83, pulse 66, temperature (!) 97.5 F (36.4 C), temperature source Oral, resp. rate 18, height 5' (1.524 m), weight 74.5 kg, last menstrual period 11/22/2022, SpO2 97%, unknown if currently breastfeeding.  Physical Exam:  General: alert, cooperative, and appears stated age 33: appropriate Uterine Fundus: firm Perineum: Healing well DVT Evaluation: No evidence of DVT seen on physical exam.  Recent Labs    08/31/23 0000 09/01/23 0414  HGB 12.6 10.9*  HCT 37.8 33.4*    Procedure Note NEXPLANON  INSERTION Left Arm Sterile Preparation:  Betadine  Insertion site was selected 8 cm from the medial epicondyle. The procedure area was prepped in sterile fashion. Adequate anesthesia was achieved with 2 mL of 1% lidocaine  injected subcutaneously. The Nexplanon  applicator was inserted subcutaneously and the Nexplanon  device was delivered. The applicator was removed from the insertion site. The capsule was palpated by myself and the patient to confirm satisfactory placement. Blood loss was minimal. A pressure dressing was applied.  [name] tolerated the procedure well with no complications. Standard post-procedure care and return precautions were explained.   Eleanor Canny, CNM Assessment/Plan: Discharge home Discharge instructions reviewed Video visit in 2 weeks Office visit in 6 weeks Nexplanon  in place   LOS: 2 days   Eleanor CHRISTELLA Canny, CNM 09/01/2023, 11:07 AM

## 2023-09-01 NOTE — Lactation Note (Signed)
 This note was copied from a baby's chart. Lactation Consultation Note  Patient Name: Martha Tucker Unijb'd Date: 09/01/2023 Age:34 hours Reason for consult: Initial assessment;Term   Maternal Data Has patient been taught Hand Expression?: No Does the patient have breastfeeding experience prior to this delivery?: No  Initial assessment w/ a P3 patient and a 25hr old baby Martha.  This was SVD.  Patient stated that she initially wanted to breastfeed but she was not seeing any milk come out so she went to formula.  Patient did not breastfeed her other children.  She did express that she assumed the milk would easily come out via breast or the pump.    Feeding Mother's Current Feeding Choice: Formula  No feeding observed during this visit. LC will return to assist patient w/ a feeding at the breast before she is discharged.   Interventions Interventions: Breast feeding basics reviewed;Education  LC provided education on the following;  milk production expectations, hunger cues, day 1/2 wet/dirty diapers, hand expression, cluster feeding, benefits of STS and arousing infant for a feeding.  Lactation informed patient of feeding infant at least 8 or more times w/in a 24hr period but not exceeding 3hrs. Patient verbalized understanding.      Discharge Pump: Personal;Hands Martha Tucker WIC Program: Yes  Consult Status Consult Status: Follow-up Date: 09/01/23 Follow-up type: In-patient    Martha Tucker 09/01/2023, 10:12 AM

## 2023-09-01 NOTE — Discharge Instructions (Signed)
 SAFE SLEEP:  It is best for baby to sleep on a firm surface on his/her back. no extra blankets, stuffed animals, or crib bumpers around them.  No co-sleeping with baby in the bed with you.  Baby cannot turn his/her neck to move something off their face and they can easily be smothered.   SKIN/UMBILICAL CORD:  Monitor baby's skin for yellow color and in whites of eyes.  Jaundice can indicate a high level of bilirubin (produced during breakdown of red blood cells).  We have checked baby's levels prior to leaving but there is still a chance it could increase upon leaving the hospital.   Baby's skin is very thin & dry. You only need to give baby a bath 2-3x per week.   The umbilical cord will fall off in a week or so. Keep it clean and dry.  Do not submerge it in water until it falls off. Give your baby sponge baths until it falls off.  Keep the cord outside of the diaper (you can fold down top of diaper).   SAFETY: Acrocyanosis (blue colored hands and feet) is normal in a newborn.  It is NOT normal for baby's mouth/lips or trunk of body to be any shade of blue. This is a medical emergency.   FEEDING/DIAPER CHANGES: Continue to feed baby with cues. Your baby should feed at least 8 times in a 24hr. period.  Cluster feeding is also normal where baby will feed constantly over a period of time.  You still need to keep track of how much baby is eating and wet/dry diapers, just like we have been doing here.  The best way to know baby is getting enough is using days of life and how many wet diapers (day 2= 2 wet diapers, day 4= 4 wet diapers, etc.)   Dirty diapers can be a little different. Baby can have 2 or more dirty diapers per day or they can sometimes take a break between days with no dirty diapers.  Baby's poop starts out as a black, tarry stool (called meconium) and will last 2-3 days. If baby is breast-fed, the stool will turn to a yellow, seedy appearance.   For concerns about your  baby, please call your pediatrician.  For breastfeeding concerns, the lactation consultant can be reached at [phone].

## 2023-09-02 NOTE — Anesthesia Postprocedure Evaluation (Signed)
 Anesthesia Post Note  Patient: Martha Tucker  Procedure(s) Performed: AN AD HOC LABOR EPIDURAL  Anesthesia Type: Epidural Anesthetic complications: no Comments: Pt was discharged before our anesthesia team could evaluate patient. Per chart review, she was discharged without complications. Midwife note- Patient had an uncomplicated postpartum course.  She is ambulating, tolerating a regular diet, passing flatus, and urinating well. Patient is discharged home in stable condition on 09/01/23.   No notable events documented.   Last Vitals: There were no vitals filed for this visit.  Last Pain: There were no vitals filed for this visit.               Camellia Merilee Louder

## 2023-09-03 ENCOUNTER — Encounter: Payer: 59 | Admitting: Certified Nurse Midwife

## 2023-09-03 ENCOUNTER — Other Ambulatory Visit: Payer: 59

## 2023-09-10 ENCOUNTER — Telehealth (INDEPENDENT_AMBULATORY_CARE_PROVIDER_SITE_OTHER): Payer: 59 | Admitting: Certified Nurse Midwife

## 2023-09-10 DIAGNOSIS — Z1332 Encounter for screening for maternal depression: Secondary | ICD-10-CM

## 2023-09-10 NOTE — Progress Notes (Signed)
   Virtual Visit via Video Note  I connected with Martha Tucker on 09/10/23 at  10:15 AM EST by a video enabled telemedicine application and verified that I am speaking with the correct person using two identifiers.  Location: Patient: [facility] office Provider: [facility] office   I discussed the limitations of evaluation and management by telemedicine and the availability of in person appointments. The patient expressed understanding and agreed to proceed.    History of Present Illness:   Martha Tucker is a 33 y.o. G64P3003 female who presents for a 2 week televisit for mood check. She is 2 weeks postpartum following a spontaneous vaginal/cesarean delivery.  The delivery was at 40 gestational weeks.  Postpartum course has been well so far. Baby is feeding by bottle and gaining weight well. Bleeding: light, no clots or odor. Postpartum depression screening: negative.  EDPS score is 0.      The following portions of the patient's history were reviewed and updated as appropriate: allergies, current medications, past family history, past medical history, past social history, past surgical history, and problem list.   Observations/Objective:   unknown if currently breastfeeding. Gen App: NAD Psych: normal speech, affect. Good mood.        09/10/2023   10:08 AM 08/31/2023   11:17 PM 01/18/2023   10:41 AM  Edinburgh Postnatal Depression Scale Screening Tool  I have been able to laugh and see the funny side of things. 0 0 0  I have looked forward with enjoyment to things. 0 0 0  I have blamed myself unnecessarily when things went wrong. 0 0 0  I have been anxious or worried for no good reason. 0 0 0  I have felt scared or panicky for no good reason. 0 0 0  Things have been getting on top of me. 0 0 0  I have been so unhappy that I have had difficulty sleeping. 0 0 0  I have felt sad or miserable. 0 0 0  I have been so unhappy that I have been crying. 0 0 0  The thought of harming  myself has occurred to me. 0 0 0  Edinburgh Postnatal Depression Scale Total 0 0 0         Assessment and Plan:   1. Encounter for screening for maternal depression - Screening Negative today. Will rescreen at 6 week postpartum visit.     2. Postpartum state - Overall doing well. Continue routine postpartum home care.    3.  Contraception - Nexplanon  placed prior to discharge   Follow Up Instructions:     I discussed the assessment and treatment plan with the patient. The patient was provided an opportunity to ask questions and all were answered. The patient agreed with the plan and demonstrated an understanding of the instructions.   The patient was advised to call back or seek an in-person evaluation if the symptoms worsen or if the condition fails to improve as anticipated.   Harlene LITTIE Cisco, CNM

## 2023-10-09 ENCOUNTER — Telehealth: Payer: Self-pay | Admitting: Certified Nurse Midwife

## 2023-10-09 ENCOUNTER — Ambulatory Visit: Payer: 59 | Admitting: Certified Nurse Midwife

## 2023-10-09 DIAGNOSIS — Z124 Encounter for screening for malignant neoplasm of cervix: Secondary | ICD-10-CM

## 2023-10-09 DIAGNOSIS — Z1151 Encounter for screening for human papillomavirus (HPV): Secondary | ICD-10-CM

## 2023-10-09 DIAGNOSIS — Z1332 Encounter for screening for maternal depression: Secondary | ICD-10-CM

## 2023-10-09 NOTE — Telephone Encounter (Signed)
Reached to the pt about the PP visit that was scheduled on 10/09/2023 at 10:15 with Robb Matar.  Was able to reschedule appt to 3:15 on 10/09/2023 at 3:15.

## 2023-10-17 ENCOUNTER — Ambulatory Visit (INDEPENDENT_AMBULATORY_CARE_PROVIDER_SITE_OTHER): Payer: 59 | Admitting: Certified Nurse Midwife

## 2023-10-17 ENCOUNTER — Other Ambulatory Visit (HOSPITAL_COMMUNITY)
Admission: RE | Admit: 2023-10-17 | Discharge: 2023-10-17 | Disposition: A | Discharge status: Home / Self Care | Payer: 59 | Source: Ambulatory Visit | Attending: Certified Nurse Midwife | Admitting: Certified Nurse Midwife

## 2023-10-17 ENCOUNTER — Encounter: Payer: Self-pay | Admitting: Certified Nurse Midwife

## 2023-10-17 DIAGNOSIS — Z124 Encounter for screening for malignant neoplasm of cervix: Secondary | ICD-10-CM

## 2023-10-17 DIAGNOSIS — Z1151 Encounter for screening for human papillomavirus (HPV): Secondary | ICD-10-CM | POA: Insufficient documentation

## 2023-10-17 DIAGNOSIS — Z1332 Encounter for screening for maternal depression: Secondary | ICD-10-CM | POA: Diagnosis not present

## 2023-10-17 NOTE — Progress Notes (Signed)
 Post Partum Visit Note  Martha Tucker is a 33 y.o. G61P3003 female who presents for a postpartum visit. She is 7 weeks postpartum following a normal spontaneous vaginal delivery.  I have fully reviewed the prenatal and intrapartum course. The delivery was at [redacted]w[redacted]d gestational weeks.  Anesthesia: epidural. Postpartum course has been uncomplicated. Baby Noemi is doing well. Baby is feeding by bottle - Similac Total Care . Bleeding  stopped but has restarted, thinks it is a cycle and due to Nexplanon . Bowel function is normal. Bladder function is normal. Patient is not sexually active. Contraception method is Nexplanon. Postpartum depression screening: negative.   The pregnancy intention screening data noted above was reviewed. Potential methods of contraception were discussed. The patient elected to proceed with No data recorded.    Health Maintenance Due  Topic Date Due   Pneumococcal Vaccine 24-26 Years old (1 of 2 - PCV) Never done   HPV VACCINES (3 - 3-dose series) 09/07/2010   Cervical Cancer Screening (HPV/Pap Cotest)  11/23/2022   COVID-19 Vaccine (2 - 2024-25 season) 04/28/2023    The following portions of the patient's history were reviewed and updated as appropriate: allergies, current medications, past family history, past medical history, past social history, past surgical history, and problem list.  Review of Systems Pertinent items are noted in HPI.  Objective:  There were no vitals taken for this visit.   General:  alert, cooperative, appears stated age, and no distress   Breasts:  normal  Lungs: clear to auscultation bilaterally  Heart:  regular rate and rhythm, S1, S2 normal, no murmur, click, rub or gallop  Abdomen: Soft, nontender, 1-2 FB DR   GU exam:   NEFG, vagina pink, well rugated, small amount dark red to brown discharge in vaginal vault. Cervix multiparous, pap collected, negative CMT & complete involution on bimanual       Assessment:    1.  Routine postpartum follow-up   2. Encounter for screening for maternal depression   3. Cervical cancer screening   4. Encounter for screening for human papillomavirus (HPV)    Normal postpartum exam.   Plan:   Essential components of care per ACOG recommendations:  1.  Mood and well being: Patient with negative depression screening today.  - Patient tobacco use? No.   - hx of drug use? No.    2. Infant care and feeding:  -Patient currently breastmilk feeding? No.  -Social determinants of health (SDOH) reviewed in EPIC. No concerns  3. Sexuality, contraception and birth spacing - Patient does not want a pregnancy in the next year.  Desired family size is 3 children.  - Nexplanon placed prior to hospital discharge.   - Discussed birth spacing of 18 months  4. Sleep and fatigue -Encouraged family/partner/community support of 4 hrs of uninterrupted sleep to help with mood and fatigue  5. Physical Recovery  - Discussed patients delivery and complications. She describes her labor as good. - Patient had a Vaginal, no problems at delivery. Patient had an intact perineum. Perineal healing reviewed. Patient expressed understanding - Patient has urinary incontinence? No. - Patient is safe to resume physical and sexual activity  6.  Health Maintenance - HM due items addressed Yes - Last pap smear No results found for: "DIAGPAP" Pap smear done at today's visit.  -Breast Cancer screening indicated? No.   7. Chronic Disease/Pregnancy Condition follow up: None  - PCP follow up  Dominica Severin, CNM [facility]

## 2023-10-18 LAB — CYTOLOGY - PAP
Comment: NEGATIVE
Diagnosis: NEGATIVE
High risk HPV: NEGATIVE

## 2023-10-21 ENCOUNTER — Encounter: Payer: Self-pay | Admitting: Certified Nurse Midwife

## 2023-12-24 ENCOUNTER — Ambulatory Visit

## 2024-09-29 ENCOUNTER — Ambulatory Visit: Admitting: Family Medicine

## 2024-09-29 ENCOUNTER — Encounter: Payer: Self-pay | Admitting: Family Medicine

## 2024-09-29 VITALS — BP 129/95 | HR 68 | Wt 142.6 lb

## 2024-09-29 DIAGNOSIS — A6000 Herpesviral infection of urogenital system, unspecified: Secondary | ICD-10-CM

## 2024-09-29 DIAGNOSIS — Z113 Encounter for screening for infections with a predominantly sexual mode of transmission: Secondary | ICD-10-CM

## 2024-09-29 DIAGNOSIS — Z3009 Encounter for other general counseling and advice on contraception: Secondary | ICD-10-CM

## 2024-09-29 DIAGNOSIS — F17200 Nicotine dependence, unspecified, uncomplicated: Secondary | ICD-10-CM

## 2024-09-29 DIAGNOSIS — Z01419 Encounter for gynecological examination (general) (routine) without abnormal findings: Secondary | ICD-10-CM

## 2024-09-29 LAB — WET PREP FOR TRICH, YEAST, CLUE
Clue Cell Exam: NEGATIVE
Trichomonas Exam: NEGATIVE
Yeast Exam: NEGATIVE

## 2024-09-29 MED ORDER — VALACYCLOVIR HCL 500 MG PO TABS: 500.0000 mg | ORAL_TABLET | Freq: Two times a day (BID) | ORAL | 3 refills | Status: AC

## 2024-09-29 NOTE — Progress Notes (Signed)
 Pt is here for PE. Wet prep results reviewed with patient and requires no treatment per SO. Pt has been counseled about medication and being sent to the pharmacy for pick up by provider. Opportunity given to patient to ask questions for any clarifications. Questions answered.  Condoms declined. Kwadwo Yoltzin Barg,RN.

## 2024-09-29 NOTE — Progress Notes (Signed)
 " [facility] [address] [place] [address] Main phone: [phone]  Family Planning Visit - Repeat Yearly Visit  Subjective:  Martha Tucker is a 34 y.o. G3P3003  being seen today for an annual wellness visit and to discuss contraception options. The patient is currently using hormonal implant for pregnancy prevention. Patient does not want a pregnancy in the next year.   Patient reports they are looking for a method with the following characteristics:  High efficacy at preventing pregnancy  Patient has the following medical problems:  Patient Active Problem List   Diagnosis Date Noted   Smoker 1/2-3/4 ppd 11/23/2020   Genital herpes simplex type 2 06/13/2017   Chief Complaint  Patient presents with   Annual Exam    PE/pap    HPI Patient reports to clinic for PE. Has implant and is happy with it. Wants STI testing but denies symptoms.   Patient denies other concerns about self    Review of Systems  Constitutional:  Negative for weight loss.  Eyes:  Negative for blurred vision.  Respiratory:  Negative for cough and shortness of breath.   Cardiovascular:  Negative for claudication.  Gastrointestinal:  Negative for nausea.  Genitourinary:  Negative for dysuria and frequency.  Skin:  Negative for rash.  Neurological:  Negative for headaches.  Endo/Heme/Allergies:  Does not bruise/bleed easily.    See flowsheet for further details and programmatic requirements Hyperlink available at the top of the signed note in blue.  Flow sheet content below:  Pregnancy Intention Screening Does the patient want to become pregnant in the next year?: No Does the patient's partner want to become pregnant in the next year?: No Would the patient like to discuss contraceptive options today?: No Other:  Password: pink Contraception History Past methods of contraception used by patient:: Hormonal Implant Adverse  effects associated with Hormonal Implant: none Sexual History What age did you start your period?: 12 How often do you have your period?: no periods Date of last sex?: 09/26/24 Has the patient had unprotected sex within the last 5 days?: No Do you have sex with men, women, both men and women?: Men only In the past 2 months how many partners have you had sex with?: 1 In the past 12 months, how many partners have you had sex with?: 1 Is it possible that any of your sex partners in the past 12 months had sex with someone else whild they were still in a sexual relationship with you?: No What ways do you have sex?: Vaginal, Oral Do you or your partner use condoms and/or dental dams every time you have vaginal, oral or anal sex?: No Do you douche?: Yes How often?: after periods Have you ever had an STD?: Yes Have any of your partners had an STD?: Yes Partner Previous STD?: HPV Date?:  (about 14 years ago) Have you or your partner ever shot up drugs?: No Have any of your partners used drugs in the past?: No Have you or your partners exchanged money or drugs for sex?: No Risk Factors for Hep B Household, sexual, or needle sharing contact of a person infected with Hep B: No Sexual contact with a person who uses drugs not as prescribed?: No Currently or Ever used drugs not as prescribed: No HIV Positive: No PRep Patient: No Men who have sex with men: No Have Hepatitis C: No History of Incarceration: No History of Homeslessness?: No Anal sex following  anal drug use?: No Risk Factors for Hep C Currently using drugs not as prescribed: No Sexual partner(s) currently using drugs as not prescribed: No History of drug use: No HIV Positive: No People with a history of incarceration: No People born between the years of 40 and 58: No Advise Advised client to quit or stay quit. : Yes Assess # of cigarettes per day: 5 Is patient ready to quit in next 30 days?: No Counseling All Patients:  Use specific methods of contraceoptive and identify adverse effects (R), Stop tobacco use, implementing the 5A counseling approach (R), LARCS discussed, Encourage mammagram for women 78 or older and younger than 50 if conditions support (R), Emergency Contraception Offered (R) if unprotected sex in past 5 days and/or propyhlactically as indicated., Provide emergency contraception counseling (R), Typical use rates for method effectiveness (R), Delay future pregnancy from 18 months to 5 years (R) at [facility] visit, Appropriate referral for additional services as needed (R) Education: Make informed decision about family planning, Reduce risk of transmission and protection from STD's and HIV, Understand BMI >25 or >18.5 is a health risk (weight management educational materials to be provided to client requests), Review immunization history, inform client of recommended vaccines per CDC's ACIP Guidelines and refer to Immunization clinic, Results of physical assessment and labs (if performed), Warning signs for rare but serious adverse events and what to do if they experience a warning sign (including emergency 24 hour number, where to seek emergency service outside of hours of operation), When to return for follow up (planned return schedule) Contraception Wrap Up Current Method: Hormonal Implant End Method: Hormonal Implant Contraception Counseling Provided: Yes How was the end contraceptive method provided?: N/A  Diabetes screening This patient is 34 y.o. with a BMI of Body mass index is 27.85 kg/m.Martha Tucker  Is patient eligible for diabetes screening (age >35 and BMI >25)?  no  Was Hgb A1c ordered? not applicable  STI screening Patient reports 1 of partners in last year.  Does this patient desire STI screening?  Yes  Hepatitis C screening Has patient been screened once for HCV in the past?  No  No results found for: HCVAB  Does the patient meet criteria for HCV testing? No  (If yes-- Screen for HCV through  [facility]) Criteria:  Since the last HCV result, does the patient have any of the following? - Current drug use - Have a partner with drug use - Has been incarcerated  Hepatitis B screening Does the patient meet criteria for HBV testing? No Criteria:  -Household, sexual or needle sharing contact with HBV -History of drug use -HIV positive -Those with known Hep C  Cervical Cancer Screening  Result Date Procedure Results Follow-ups  10/17/2023 Cytology - PAP High risk HPV: Negative Adequacy: Satisfactory for evaluation; transformation zone component PRESENT. Diagnosis: - Negative for intraepithelial lesion or malignancy (NILM) Comment: Normal Reference Range HPV - Negative   11/23/2019 IGP, rfx Aptima HPV ASCU DIAGNOSIS:: Comment Specimen adequacy:: Comment Clinician Provided ICD10: Comment Performed by:: Comment QC reviewed by:: Comment PAP Smear Comment: . Note:: Comment Test Methodology: Comment PAP Reflex: Comment   09/30/2014 HM PAP SMEAR HM Pap smear: Negative     Health Maintenance Due  Topic Date Due   Pneumococcal Vaccine (1 of 2 - PCV) Never done   HPV VACCINES (3 - 3-dose series) 09/07/2010   Influenza Vaccine  03/27/2024   COVID-19 Vaccine (4 - 2025-26 season) 04/27/2024    The following portions of the  patient's history were reviewed and updated as appropriate: allergies, current medications, past family history, past medical history, past social history, past surgical history and problem list. Problem list updated.  Objective:   Vitals:   09/29/24 1058  BP: (!) 129/95  Pulse: 68  Weight: 142 lb 9.6 oz (64.7 kg)    Physical Exam Vitals and nursing note reviewed. Exam conducted with a chaperone present (Dr. Macario).  Constitutional:      Appearance: Normal appearance.  HENT:     Head: Normocephalic and atraumatic.     Comments: No nits or hair loss of scalp, brows, and lashes    Mouth/Throat:     Mouth: Mucous membranes are moist.     Pharynx:  Oropharynx is clear. No oropharyngeal exudate or posterior oropharyngeal erythema.  Eyes:     General:        Right eye: No discharge.        Left eye: No discharge.     Conjunctiva/sclera: Conjunctivae normal.     Right eye: Right conjunctiva is not injected.     Left eye: Left conjunctiva is not injected.  Pulmonary:     Effort: Pulmonary effort is normal.  Chest:     Chest wall: No mass.  Breasts:    Tanner Score is 5.     Right: No swelling, bleeding, inverted nipple or mass.     Left: Normal. No swelling, bleeding, inverted nipple or mass.  Abdominal:     Tenderness: There is no abdominal tenderness. There is no rebound.  Genitourinary:    General: Normal vulva.     Exam position: Lithotomy position.     Pubic Area: No rash or pubic lice.      Labia:        Right: No rash or lesion.        Left: No rash or lesion.      Vagina: Normal. No vaginal discharge, erythema or lesions.     Cervix: No cervical motion tenderness, discharge, lesion or erythema.     Uterus: Not enlarged and not tender.      Rectum: Normal.     Comments: Vaginal bleeding present  Lymphadenopathy:     Cervical: No cervical adenopathy.     Upper Body:     Right upper body: No supraclavicular, axillary or epitrochlear adenopathy.     Left upper body: No supraclavicular, axillary or epitrochlear adenopathy.     Lower Body: No right inguinal adenopathy. No left inguinal adenopathy.  Skin:    General: Skin is warm and dry.     Findings: No lesion or rash.  Neurological:     Mental Status: She is alert and oriented to person, place, and time.     Assessment and Plan:  Martha Tucker is a 34 y.o. female G3P3003 presenting to the [facility] Department for an yearly wellness and contraception visit  1. Family planning (Primary) Contraception counseling:  Reviewed options based on patient desire and reproductive life plan. Patient is interested in Hormonal Implant. This was not provided  to the patient today. Implant already in place   Risks, benefits, and typical effectiveness rates were reviewed.  Questions were answered.  Written information was also given to the patient to review.    The patient will follow up in  1 years for surveillance.  The patient was told to call with any further questions, or with any concerns about this method of contraception.  Emphasized use of condoms 100% of  the time for STI prevention.  Emergency Contraception Precautions (ECP): Patient assessed for need of ECP. She is not a candidate based on LARC in place and unexpired .  Educated on ECP and reviewed options.  Patient desires no method - patient politely declines any emergency contraception.   2. Genital herpes simplex type 2 -refill provided for suppression therapy per pt request  - valACYclovir  (VALTREX ) 500 MG tablet; Take 1 tablet (500 mg total) by mouth 2 (two) times daily.  Dispense: 180 tablet; Refill: 3  3. Well woman exam with routine gynecological exam CBE today (wnl), next due in 3 years Pap smear up to date  4. Screening for venereal disease  - Chlamydia/Gonorrhea [facility] - HIV [facility] - Syphilis Serology, [facility] - WET PREP FOR TRICH, YEAST, CLUE  5. Smoker 1/2-3/4 ppd -reports she is smoking 5 cig per day -not ready to quit    Return in about 1 year (around 09/29/2025) for annual well-woman exam.  No future appointments.  Verneta Bers, [place] "
